# Patient Record
Sex: Female | Born: 1980 | Race: White | Hispanic: No | Marital: Married | Smoking: Never smoker
Health system: Southern US, Community
[De-identification: ages and names within clinical notes are randomized; demographics above are authoritative.]

## PROBLEM LIST (undated history)

## (undated) DIAGNOSIS — R51 Headache: Secondary | ICD-10-CM

## (undated) DIAGNOSIS — R519 Headache, unspecified: Secondary | ICD-10-CM

## (undated) DIAGNOSIS — Z789 Other specified health status: Secondary | ICD-10-CM

## (undated) HISTORY — PX: WISDOM TOOTH EXTRACTION: SHX21

## (undated) HISTORY — PX: APPENDECTOMY: SHX54

---

## 2003-07-19 ENCOUNTER — Other Ambulatory Visit: Admission: RE | Admit: 2003-07-19 | Discharge: 2003-07-19 | Payer: Self-pay | Admitting: Obstetrics and Gynecology

## 2006-08-05 ENCOUNTER — Observation Stay (HOSPITAL_COMMUNITY): Admission: RE | Admit: 2006-08-05 | Discharge: 2006-08-06 | Payer: Self-pay | Admitting: Family Medicine

## 2012-08-25 LAB — OB RESULTS CONSOLE HIV ANTIBODY (ROUTINE TESTING): HIV: NONREACTIVE

## 2012-08-25 LAB — OB RESULTS CONSOLE RPR: RPR: NONREACTIVE

## 2012-08-25 LAB — OB RESULTS CONSOLE ABO/RH: RH Type: POSITIVE

## 2012-08-25 LAB — OB RESULTS CONSOLE GC/CHLAMYDIA
Chlamydia: NEGATIVE
Gonorrhea: NEGATIVE

## 2012-08-25 LAB — OB RESULTS CONSOLE RUBELLA ANTIBODY, IGM: Rubella: IMMUNE

## 2012-10-28 ENCOUNTER — Inpatient Hospital Stay (HOSPITAL_COMMUNITY): Admit: 2012-10-28 | Payer: Self-pay | Admitting: Obstetrics and Gynecology

## 2013-04-06 ENCOUNTER — Inpatient Hospital Stay (HOSPITAL_COMMUNITY): Admission: RE | Admit: 2013-04-06 | Payer: BC Managed Care – PPO | Source: Ambulatory Visit

## 2013-04-06 ENCOUNTER — Inpatient Hospital Stay (HOSPITAL_COMMUNITY): Payer: BC Managed Care – PPO | Admitting: Anesthesiology

## 2013-04-06 ENCOUNTER — Encounter (HOSPITAL_COMMUNITY): Payer: Self-pay | Admitting: *Deleted

## 2013-04-06 ENCOUNTER — Inpatient Hospital Stay (HOSPITAL_COMMUNITY)
Admission: AD | Admit: 2013-04-06 | Discharge: 2013-04-09 | DRG: 371 | Disposition: A | Discharge status: Home / Self Care | Payer: BC Managed Care – PPO | Source: Ambulatory Visit | Attending: Obstetrics and Gynecology | Admitting: Obstetrics and Gynecology

## 2013-04-06 ENCOUNTER — Encounter (HOSPITAL_COMMUNITY): Payer: Self-pay | Admitting: Anesthesiology

## 2013-04-06 ENCOUNTER — Encounter (HOSPITAL_COMMUNITY)
Admission: AD | Disposition: A | Discharge status: Home / Self Care | Payer: Self-pay | Source: Ambulatory Visit | Attending: Obstetrics and Gynecology

## 2013-04-06 DIAGNOSIS — O4290 Premature rupture of membranes, unspecified as to length of time between rupture and onset of labor, unspecified weeks of gestation: Secondary | ICD-10-CM | POA: Diagnosis present

## 2013-04-06 DIAGNOSIS — O99892 Other specified diseases and conditions complicating childbirth: Secondary | ICD-10-CM | POA: Diagnosis present

## 2013-04-06 DIAGNOSIS — O324XX0 Maternal care for high head at term, not applicable or unspecified: Secondary | ICD-10-CM | POA: Diagnosis present

## 2013-04-06 DIAGNOSIS — Z22330 Carrier of Group B streptococcus: Secondary | ICD-10-CM

## 2013-04-06 HISTORY — DX: Other specified health status: Z78.9

## 2013-04-06 LAB — CBC
HCT: 34.9 % — ABNORMAL LOW (ref 36.0–46.0)
Hemoglobin: 11.6 g/dL — ABNORMAL LOW (ref 12.0–15.0)
MCHC: 33.2 g/dL (ref 30.0–36.0)
RBC: 3.97 MIL/uL (ref 3.87–5.11)
WBC: 9.1 10*3/uL (ref 4.0–10.5)

## 2013-04-06 LAB — RPR: RPR Ser Ql: NONREACTIVE

## 2013-04-06 SURGERY — Surgical Case
Anesthesia: Epidural | Site: Abdomen | Wound class: Clean Contaminated

## 2013-04-06 MED ORDER — SIMETHICONE 80 MG PO CHEW: 80.0000 mg | CHEWABLE_TABLET | ORAL | Status: DC | PRN

## 2013-04-06 MED ORDER — KETOROLAC TROMETHAMINE 30 MG/ML IJ SOLN: 30.0000 mg | Freq: Four times a day (QID) | INTRAMUSCULAR | Status: DC | PRN

## 2013-04-06 MED ORDER — TETANUS-DIPHTH-ACELL PERTUSSIS 5-2.5-18.5 LF-MCG/0.5 IM SUSP: 0.5000 mL | Freq: Once | INTRAMUSCULAR | Status: DC

## 2013-04-06 MED ORDER — IBUPROFEN 600 MG PO TABS: 600.0000 mg | ORAL_TABLET | Freq: Four times a day (QID) | ORAL | Status: DC

## 2013-04-06 MED ORDER — OXYTOCIN 40 UNITS IN LACTATED RINGERS INFUSION - SIMPLE MED: 62.5000 mL/h | INTRAVENOUS | Status: DC

## 2013-04-06 MED ORDER — DIPHENHYDRAMINE HCL 50 MG/ML IJ SOLN: 12.5000 mg | INTRAMUSCULAR | Status: DC | PRN

## 2013-04-06 MED ORDER — NALOXONE HCL 1 MG/ML IJ SOLN
1.0000 ug/kg/h | INTRAVENOUS | Status: DC | PRN
  Filled 2013-04-06: qty 2

## 2013-04-06 MED ORDER — MORPHINE SULFATE 0.5 MG/ML IJ SOLN
INTRAMUSCULAR | Status: AC
  Filled 2013-04-06: qty 10

## 2013-04-06 MED ORDER — SENNOSIDES-DOCUSATE SODIUM 8.6-50 MG PO TABS: 2.0000 | ORAL_TABLET | Freq: Every day | ORAL | Status: DC

## 2013-04-06 MED ORDER — LACTATED RINGERS IV SOLN
INTRAVENOUS | Status: DC | PRN
  Administered 2013-04-06: 18:00:00 via INTRAVENOUS

## 2013-04-06 MED ORDER — DIPHENHYDRAMINE HCL 25 MG PO CAPS: 25.0000 mg | ORAL_CAPSULE | Freq: Four times a day (QID) | ORAL | Status: DC | PRN

## 2013-04-06 MED ORDER — TERBUTALINE SULFATE 1 MG/ML IJ SOLN: 0.2500 mg | Freq: Once | INTRAMUSCULAR | Status: DC | PRN

## 2013-04-06 MED ORDER — CITRIC ACID-SODIUM CITRATE 334-500 MG/5ML PO SOLN
30.0000 mL | ORAL | Status: DC | PRN
  Administered 2013-04-06: 30 mL via ORAL
  Filled 2013-04-06: qty 15

## 2013-04-06 MED ORDER — KETOROLAC TROMETHAMINE 60 MG/2ML IM SOLN
60.0000 mg | Freq: Once | INTRAMUSCULAR | Status: AC | PRN
  Administered 2013-04-06: 60 mg via INTRAMUSCULAR

## 2013-04-06 MED ORDER — OXYCODONE-ACETAMINOPHEN 5-325 MG PO TABS: 1.0000 | ORAL_TABLET | ORAL | Status: DC | PRN

## 2013-04-06 MED ORDER — OXYTOCIN 10 UNIT/ML IJ SOLN
40.0000 [IU] | INTRAVENOUS | Status: DC | PRN
  Administered 2013-04-06: 40 [IU] via INTRAVENOUS

## 2013-04-06 MED ORDER — ONDANSETRON HCL 4 MG/2ML IJ SOLN: 4.0000 mg | Freq: Three times a day (TID) | INTRAMUSCULAR | Status: DC | PRN

## 2013-04-06 MED ORDER — OXYTOCIN 40 UNITS IN LACTATED RINGERS INFUSION - SIMPLE MED
1.0000 m[IU]/min | INTRAVENOUS | Status: DC
  Administered 2013-04-06: 2 m[IU]/min via INTRAVENOUS
  Filled 2013-04-06: qty 1000

## 2013-04-06 MED ORDER — ERYTHROMYCIN 5 MG/GM OP OINT
TOPICAL_OINTMENT | OPHTHALMIC | Status: AC
  Filled 2013-04-06: qty 1

## 2013-04-06 MED ORDER — FLEET ENEMA 7-19 GM/118ML RE ENEM: 1.0000 | ENEMA | Freq: Once | RECTAL | Status: DC

## 2013-04-06 MED ORDER — ONDANSETRON HCL 4 MG/2ML IJ SOLN: 4.0000 mg | Freq: Four times a day (QID) | INTRAMUSCULAR | Status: DC | PRN

## 2013-04-06 MED ORDER — SODIUM BICARBONATE 8.4 % IV SOLN
INTRAVENOUS | Status: DC | PRN
  Administered 2013-04-06: 5 mL via EPIDURAL
  Administered 2013-04-06: 3 mL via EPIDURAL
  Administered 2013-04-06: 5 mL via EPIDURAL

## 2013-04-06 MED ORDER — NALBUPHINE SYRINGE 5 MG/0.5 ML
5.0000 mg | INJECTION | INTRAMUSCULAR | Status: DC | PRN
  Filled 2013-04-06: qty 1

## 2013-04-06 MED ORDER — WITCH HAZEL-GLYCERIN EX PADS: 1.0000 "application " | MEDICATED_PAD | CUTANEOUS | Status: DC | PRN

## 2013-04-06 MED ORDER — FENTANYL 2.5 MCG/ML BUPIVACAINE 1/10 % EPIDURAL INFUSION (WH - ANES)
14.0000 mL/h | INTRAMUSCULAR | Status: DC | PRN
  Filled 2013-04-06: qty 125

## 2013-04-06 MED ORDER — SIMETHICONE 80 MG PO CHEW
80.0000 mg | CHEWABLE_TABLET | Freq: Three times a day (TID) | ORAL | Status: DC
  Administered 2013-04-07 – 2013-04-09 (×9): 80 mg via ORAL

## 2013-04-06 MED ORDER — LACTATED RINGERS IV SOLN: 500.0000 mL | Freq: Once | INTRAVENOUS | Status: DC

## 2013-04-06 MED ORDER — OXYTOCIN 10 UNIT/ML IJ SOLN
INTRAMUSCULAR | Status: AC
  Filled 2013-04-06: qty 4

## 2013-04-06 MED ORDER — MEPERIDINE HCL 25 MG/ML IJ SOLN: 6.2500 mg | INTRAMUSCULAR | Status: DC | PRN

## 2013-04-06 MED ORDER — ZOLPIDEM TARTRATE 5 MG PO TABS: 5.0000 mg | ORAL_TABLET | Freq: Every evening | ORAL | Status: DC | PRN

## 2013-04-06 MED ORDER — LORATADINE 10 MG PO TABS
10.0000 mg | ORAL_TABLET | Freq: Every day | ORAL | Status: DC
  Administered 2013-04-07 – 2013-04-09 (×3): 10 mg via ORAL
  Filled 2013-04-06 (×4): qty 1

## 2013-04-06 MED ORDER — 0.9 % SODIUM CHLORIDE (POUR BTL) OPTIME
TOPICAL | Status: DC | PRN
  Administered 2013-04-06: 1000 mL

## 2013-04-06 MED ORDER — IBUPROFEN 600 MG PO TABS
600.0000 mg | ORAL_TABLET | Freq: Four times a day (QID) | ORAL | Status: DC
  Administered 2013-04-07 – 2013-04-09 (×10): 600 mg via ORAL
  Filled 2013-04-06 (×10): qty 1

## 2013-04-06 MED ORDER — ACETAMINOPHEN 10 MG/ML IV SOLN
1000.0000 mg | Freq: Four times a day (QID) | INTRAVENOUS | Status: DC | PRN
  Filled 2013-04-06: qty 100

## 2013-04-06 MED ORDER — OXYTOCIN BOLUS FROM INFUSION: 500.0000 mL | INTRAVENOUS | Status: DC

## 2013-04-06 MED ORDER — DIBUCAINE 1 % RE OINT: 1.0000 "application " | TOPICAL_OINTMENT | RECTAL | Status: DC | PRN

## 2013-04-06 MED ORDER — PHENYLEPHRINE 40 MCG/ML (10ML) SYRINGE FOR IV PUSH (FOR BLOOD PRESSURE SUPPORT): 80.0000 ug | PREFILLED_SYRINGE | INTRAVENOUS | Status: DC | PRN

## 2013-04-06 MED ORDER — PHENYLEPHRINE 40 MCG/ML (10ML) SYRINGE FOR IV PUSH (FOR BLOOD PRESSURE SUPPORT)
80.0000 ug | PREFILLED_SYRINGE | INTRAVENOUS | Status: DC | PRN
  Filled 2013-04-06: qty 5

## 2013-04-06 MED ORDER — LACTATED RINGERS IV SOLN
INTRAVENOUS | Status: DC
  Administered 2013-04-06: 125 mL/h via INTRAVENOUS
  Administered 2013-04-06 (×3): via INTRAVENOUS

## 2013-04-06 MED ORDER — FENTANYL 2.5 MCG/ML BUPIVACAINE 1/10 % EPIDURAL INFUSION (WH - ANES)
14.0000 mL/h | INTRAMUSCULAR | Status: DC | PRN
  Administered 2013-04-06: 14 mL/h via EPIDURAL

## 2013-04-06 MED ORDER — MENTHOL 3 MG MT LOZG: 1.0000 | LOZENGE | OROMUCOSAL | Status: DC | PRN

## 2013-04-06 MED ORDER — SODIUM CHLORIDE 0.9 % IJ SOLN: 3.0000 mL | INTRAMUSCULAR | Status: DC | PRN

## 2013-04-06 MED ORDER — ONDANSETRON HCL 4 MG/2ML IJ SOLN
INTRAMUSCULAR | Status: AC
  Filled 2013-04-06: qty 2

## 2013-04-06 MED ORDER — ONDANSETRON HCL 4 MG/2ML IJ SOLN
INTRAMUSCULAR | Status: DC | PRN
  Administered 2013-04-06: 4 mg via INTRAVENOUS

## 2013-04-06 MED ORDER — METOCLOPRAMIDE HCL 5 MG/ML IJ SOLN: 10.0000 mg | Freq: Three times a day (TID) | INTRAMUSCULAR | Status: DC | PRN

## 2013-04-06 MED ORDER — LIDOCAINE HCL (PF) 1 % IJ SOLN
INTRAMUSCULAR | Status: DC | PRN
  Administered 2013-04-06 (×2): 5 mL

## 2013-04-06 MED ORDER — LACTATED RINGERS IV SOLN: INTRAVENOUS | Status: DC

## 2013-04-06 MED ORDER — EPHEDRINE 5 MG/ML INJ: 10.0000 mg | INTRAVENOUS | Status: DC | PRN

## 2013-04-06 MED ORDER — LACTATED RINGERS IV SOLN: 500.0000 mL | INTRAVENOUS | Status: DC | PRN

## 2013-04-06 MED ORDER — PENICILLIN G POTASSIUM 5000000 UNITS IJ SOLR
5.0000 10*6.[IU] | Freq: Once | INTRAVENOUS | Status: AC
  Administered 2013-04-06: 5 10*6.[IU] via INTRAVENOUS
  Filled 2013-04-06: qty 5

## 2013-04-06 MED ORDER — ONDANSETRON HCL 4 MG PO TABS: 4.0000 mg | ORAL_TABLET | ORAL | Status: DC | PRN

## 2013-04-06 MED ORDER — EPHEDRINE 5 MG/ML INJ
10.0000 mg | INTRAVENOUS | Status: DC | PRN
  Filled 2013-04-06: qty 4

## 2013-04-06 MED ORDER — SCOPOLAMINE 1 MG/3DAYS TD PT72
1.0000 | MEDICATED_PATCH | Freq: Once | TRANSDERMAL | Status: DC
  Administered 2013-04-06: 1.5 mg via TRANSDERMAL

## 2013-04-06 MED ORDER — FENTANYL CITRATE 0.05 MG/ML IJ SOLN: 25.0000 ug | INTRAMUSCULAR | Status: DC | PRN

## 2013-04-06 MED ORDER — LANOLIN HYDROUS EX OINT: 1.0000 "application " | TOPICAL_OINTMENT | CUTANEOUS | Status: DC | PRN

## 2013-04-06 MED ORDER — ONDANSETRON HCL 4 MG/2ML IJ SOLN: 4.0000 mg | INTRAMUSCULAR | Status: DC | PRN

## 2013-04-06 MED ORDER — ACETAMINOPHEN 325 MG PO TABS: 650.0000 mg | ORAL_TABLET | ORAL | Status: DC | PRN

## 2013-04-06 MED ORDER — MORPHINE SULFATE (PF) 0.5 MG/ML IJ SOLN
INTRAMUSCULAR | Status: DC | PRN
  Administered 2013-04-06: 4 mg via EPIDURAL

## 2013-04-06 MED ORDER — DIPHENHYDRAMINE HCL 25 MG PO CAPS: 25.0000 mg | ORAL_CAPSULE | ORAL | Status: DC | PRN

## 2013-04-06 MED ORDER — PRENATAL MULTIVITAMIN CH: 1.0000 | ORAL_TABLET | Freq: Every day | ORAL | Status: DC

## 2013-04-06 MED ORDER — LIDOCAINE HCL (PF) 1 % IJ SOLN
30.0000 mL | INTRAMUSCULAR | Status: DC | PRN
  Filled 2013-04-06: qty 30

## 2013-04-06 MED ORDER — LACTATED RINGERS IV SOLN
INTRAVENOUS | Status: DC
  Administered 2013-04-06: via INTRAVENOUS

## 2013-04-06 MED ORDER — NALOXONE HCL 0.4 MG/ML IJ SOLN: 0.4000 mg | INTRAMUSCULAR | Status: DC | PRN

## 2013-04-06 MED ORDER — OXYCODONE-ACETAMINOPHEN 5-325 MG PO TABS
1.0000 | ORAL_TABLET | ORAL | Status: DC | PRN
  Administered 2013-04-07 (×2): 1 via ORAL
  Administered 2013-04-08 – 2013-04-09 (×3): 2 via ORAL
  Administered 2013-04-09: 1 via ORAL
  Administered 2013-04-09: 2 via ORAL
  Filled 2013-04-06 (×3): qty 1
  Filled 2013-04-06 (×2): qty 2
  Filled 2013-04-06: qty 1
  Filled 2013-04-06: qty 2
  Filled 2013-04-06: qty 1

## 2013-04-06 MED ORDER — SIMETHICONE 80 MG PO CHEW: 80.0000 mg | CHEWABLE_TABLET | Freq: Three times a day (TID) | ORAL | Status: DC

## 2013-04-06 MED ORDER — KETOROLAC TROMETHAMINE 60 MG/2ML IM SOLN
INTRAMUSCULAR | Status: AC
  Filled 2013-04-06: qty 2

## 2013-04-06 MED ORDER — IBUPROFEN 600 MG PO TABS: 600.0000 mg | ORAL_TABLET | Freq: Four times a day (QID) | ORAL | Status: DC | PRN

## 2013-04-06 MED ORDER — SENNOSIDES-DOCUSATE SODIUM 8.6-50 MG PO TABS
2.0000 | ORAL_TABLET | Freq: Every day | ORAL | Status: DC
  Administered 2013-04-07 – 2013-04-08 (×2): 2 via ORAL

## 2013-04-06 MED ORDER — PENICILLIN G POTASSIUM 5000000 UNITS IJ SOLR
2.5000 10*6.[IU] | INTRAVENOUS | Status: DC
  Administered 2013-04-06: 2.5 10*6.[IU] via INTRAVENOUS
  Filled 2013-04-06 (×5): qty 2.5

## 2013-04-06 MED ORDER — OXYTOCIN 40 UNITS IN LACTATED RINGERS INFUSION - SIMPLE MED: 62.5000 mL/h | INTRAVENOUS | Status: AC

## 2013-04-06 MED ORDER — PRENATAL MULTIVITAMIN CH
1.0000 | ORAL_TABLET | Freq: Every day | ORAL | Status: DC
  Administered 2013-04-07 – 2013-04-09 (×3): 1 via ORAL
  Filled 2013-04-06 (×2): qty 1

## 2013-04-06 MED ORDER — DIPHENHYDRAMINE HCL 50 MG/ML IJ SOLN: 25.0000 mg | INTRAMUSCULAR | Status: DC | PRN

## 2013-04-06 MED ORDER — LACTATED RINGERS IV SOLN
500.0000 mL | Freq: Once | INTRAVENOUS | Status: AC
  Administered 2013-04-06: 500 mL via INTRAVENOUS

## 2013-04-06 MED ORDER — CEFAZOLIN SODIUM-DEXTROSE 2-3 GM-% IV SOLR
2.0000 g | Freq: Once | INTRAVENOUS | Status: AC
  Administered 2013-04-06: 2 g via INTRAVENOUS
  Filled 2013-04-06: qty 50

## 2013-04-06 MED ORDER — SCOPOLAMINE 1 MG/3DAYS TD PT72
MEDICATED_PATCH | TRANSDERMAL | Status: AC
  Filled 2013-04-06: qty 1

## 2013-04-06 SURGICAL SUPPLY — 31 items
ADH SKN CLS APL DERMABOND .7 (GAUZE/BANDAGES/DRESSINGS)
CLAMP CORD UMBIL (MISCELLANEOUS) IMPLANT
CLOTH BEACON ORANGE TIMEOUT ST (SAFETY) ×2 IMPLANT
DERMABOND ADVANCED (GAUZE/BANDAGES/DRESSINGS)
DERMABOND ADVANCED .7 DNX12 (GAUZE/BANDAGES/DRESSINGS) IMPLANT
DRAPE LG THREE QUARTER DISP (DRAPES) ×2 IMPLANT
DRSG OPSITE POSTOP 4X10 (GAUZE/BANDAGES/DRESSINGS) ×2 IMPLANT
DURAPREP 26ML APPLICATOR (WOUND CARE) ×2 IMPLANT
ELECT REM PT RETURN 9FT ADLT (ELECTROSURGICAL) ×2
ELECTRODE REM PT RTRN 9FT ADLT (ELECTROSURGICAL) ×1 IMPLANT
EXTRACTOR VACUUM M CUP 4 TUBE (SUCTIONS) IMPLANT
GLOVE BIO SURGEON STRL SZ 6 (GLOVE) ×2 IMPLANT
GLOVE BIOGEL PI IND STRL 6 (GLOVE) ×2 IMPLANT
GLOVE BIOGEL PI INDICATOR 6 (GLOVE) ×2
GOWN STRL REIN XL XLG (GOWN DISPOSABLE) ×4 IMPLANT
KIT ABG SYR 3ML LUER SLIP (SYRINGE) ×2 IMPLANT
NDL HYPO 25X5/8 SAFETYGLIDE (NEEDLE) ×1 IMPLANT
NEEDLE HYPO 25X5/8 SAFETYGLIDE (NEEDLE) ×2 IMPLANT
NS IRRIG 1000ML POUR BTL (IV SOLUTION) ×2 IMPLANT
PACK C SECTION WH (CUSTOM PROCEDURE TRAY) ×2 IMPLANT
PAD OB MATERNITY 4.3X12.25 (PERSONAL CARE ITEMS) ×2 IMPLANT
STAPLER VISISTAT 35W (STAPLE) ×1 IMPLANT
SUT CHROMIC 0 CTX 36 (SUTURE) ×5 IMPLANT
SUT MON AB 2-0 CT1 27 (SUTURE) ×2 IMPLANT
SUT PDS AB 0 CT1 27 (SUTURE) IMPLANT
SUT PLAIN 0 NONE (SUTURE) IMPLANT
SUT VIC AB 0 CT1 36 (SUTURE) ×1 IMPLANT
SUT VIC AB 4-0 KS 27 (SUTURE) IMPLANT
TOWEL OR 17X24 6PK STRL BLUE (TOWEL DISPOSABLE) ×6 IMPLANT
TRAY FOLEY CATH 14FR (SET/KITS/TRAYS/PACK) IMPLANT
WATER STERILE IRR 1000ML POUR (IV SOLUTION) ×1 IMPLANT

## 2013-04-06 NOTE — Op Note (Signed)
Jarold Song PROCEDURE DATE: 04/06/2013  PREOPERATIVE DIAGNOSIS: Intrauterine pregnancy at  [redacted]w[redacted]d weeks gestation with arrest of descent  POSTOPERATIVE DIAGNOSIS: The same  PROCEDURE:    Low Transverse Cesarean Section  SURGEON:  Dr. Mitchel Honour  INDICATIONS: Amber Downs is a 31 y.o. G1P1001 at [redacted]w[redacted]d scheduled for cesarean section secondary arrest of descent.  The risks of cesarean section discussed with the patient included but were not limited to: bleeding which may require transfusion or reoperation; infection which may require antibiotics; injury to bowel, bladder, ureters or other surrounding organs; injury to the fetus; need for additional procedures including hysterectomy in the event of a life-threatening hemorrhage; placental abnormalities wth subsequent pregnancies, incisional problems, thromboembolic phenomenon and other postoperative/anesthesia complications. The patient concurred with the proposed plan, giving informed written consent for the procedure.    FINDINGS:  Viable female infant in cephalic presentation, APGAR 9,9  weight pending Clear amniotic fluid.  Intact placenta, three vessel cord.  Grossly normal uterus, ovaries and fallopian tubes. .   ANESTHESIA:    Epidural ESTIMATED BLOOD LOSS: 800 ml SPECIMENS: Placenta sent to L&D COMPLICATIONS: None immediate  PROCEDURE IN DETAIL:  The patient received intravenous antibiotics and had sequential compression devices applied to her lower extremities while in the preoperative area.  She was then taken to the operating room where epidural anesthesia was dosed up to surgical level and was found to be adequate. She was then placed in a dorsal supine position with a leftward tilt, and prepped and draped in a sterile manner.  A foley catheter was placed into her bladder and attached to constant gravity.  After an adequate timeout was performed, a Pfannenstiel skin incision was made with scalpel and carried through to the underlying  layer of fascia. The fascia was incised in the midline and this incision was extended bilaterally using the Mayo scissors. Kocher clamps were applied to the superior aspect of the fascial incision and the underlying rectus muscles were dissected off bluntly. A similar process was carried out on the inferior aspect of the facial incision. The rectus muscles were separated in the midline bluntly and the peritoneum was entered bluntly.  A bladder flap was created sharply and developed bluntly.  It was protected behind the bladder blade.   A transverse hysterotomy was made with a scalpel and extended bilaterally bluntly. The bladder blade was then removed. The infant was successfully delivered, and cord was clamped and cut and infant was handed over to awaiting neonatology team. Uterine massage was then administered and the placenta delivered intact with three-vessel cord. The uterus was cleared of clot and debris.  The hysterotomy was closed with #1 Chromic.  A second imbricating suture of #1 Chromic was used to reinforce the incision and aid in hemostasis.  The peritoneum and rectus muscles were noted to be hemostatic and were reapproximated with 2-0 Monocryl.  The fascia was closed with 0-Vicryl in a running fashion with good restoration of anatomy.  The subcutaneus tissue was copiously irrigated.  The skin was closed with staples.  Pt tolerated the procedure will.  All counts were correct x2.  Pt went to the recovery room in stable condition.

## 2013-04-06 NOTE — Progress Notes (Signed)
Patient has been pushing x 2 hours with no descent from +2 station, significant caput, and narrow pelvis.  She is feeling severe pressure and exhausted.  FHT reassuring.  She is offered C/S for arrest of descent and wishes to proceed.  She is informed of the risk of bleeding, damage to surrounding structures, infection, and effects in future pregnancies.  All of her questions were answered and she wishes to proceed.

## 2013-04-06 NOTE — MAU Note (Signed)
Pt states her water broke at 0430-clear fluid-no contractions

## 2013-04-06 NOTE — Anesthesia Preprocedure Evaluation (Signed)

## 2013-04-06 NOTE — H&P (Signed)
Amber Downs is a 32 y.o. female presenting for PROM at 0430 this am.  She reports beginning to feel occasional mild CTX.  No VB.  +FM.  No antepartum complications.  GBS negative.  Maternal Medical History:  Reason for admission: Rupture of membranes.   Contractions: Onset was 3-5 hours ago.    Fetal activity: Perceived fetal activity is normal.   Last perceived fetal movement was within the past hour.    Prenatal complications: no prenatal complications Prenatal Complications - Diabetes: none.    OB History   Grav Para Term Preterm Abortions TAB SAB Ect Mult Living   1              Past Medical History  Diagnosis Date  . Medical history non-contributory    Past Surgical History  Procedure Laterality Date  . Appendectomy     Family History: family history is not on file. Social History:  reports that she has never smoked. She does not have any smokeless tobacco history on file. She reports that she does not drink alcohol or use illicit drugs.   Prenatal Transfer Tool  Maternal Diabetes: No Genetic Screening: Normal Maternal Ultrasounds/Referrals: Normal Fetal Ultrasounds or other Referrals:  None Maternal Substance Abuse:  No Significant Maternal Medications:  None Significant Maternal Lab Results:  None Other Comments:  None  ROS  Dilation: 1 Effacement (%): 80 Station: -2 Exam by:: M.Topp,RN Blood pressure 117/75, pulse 98, temperature 98 F (36.7 C), temperature source Oral, resp. rate 20, height 5\' 3"  (1.6 m), weight 81.251 kg (179 lb 2 oz). Maternal Exam:  Uterine Assessment: Contraction strength is mild.  Contraction frequency is rare.   Abdomen: Patient reports no abdominal tenderness. Fundal height is c/w dates.   Estimated fetal weight is 7#8.   Fetal presentation: vertex     Physical Exam  Constitutional: She is oriented to person, place, and time. She appears well-developed and well-nourished.  GI: Soft. There is no rebound and no guarding.   Neurological: She is alert and oriented to person, place, and time.  Skin: Skin is warm and dry.  Psychiatric: She has a normal mood and affect. Her behavior is normal.    Prenatal labs: ABO, Rh: O/Positive/-- (10/29 0000) Antibody: Negative (10/29 0000) Rubella: Immune (10/29 0000) RPR: Nonreactive (10/29 0000)  HBsAg: Negative (10/29 0000)  HIV: Non-reactive (10/29 0000)  GBS: Positive (05/06 0000)   Assessment/Plan: 31yo G1 at [redacted]w[redacted]d with PROM -Will start pitocin -Epidural if desired -GBS negative   Juanda Luba 04/06/2013, 7:59 AM

## 2013-04-06 NOTE — Anesthesia Postprocedure Evaluation (Signed)
  Anesthesia Post-op Note  Patient: Amber Downs  Procedure(s) Performed: Procedure(s) with comments: CESAREAN SECTION (N/A) - Primary Cesarean Section Delivery Baby Girl @ [phone], Apgars  Patient is awake, responsive, moving her legs, and has signs of resolution of her numbness. Pain and nausea are reasonably well controlled. Vital signs are stable and clinically acceptable. Oxygen saturation is clinically acceptable. There are no apparent anesthetic complications at this time. Patient is ready for discharge.

## 2013-04-06 NOTE — Transfer of Care (Signed)
Immediate Anesthesia Transfer of Care Note  Patient: Amber Downs  Procedure(s) Performed: Procedure(s) with comments: CESAREAN SECTION (N/A) - Primary Cesarean Section Delivery Baby Girl @ 1803, Apgars  Patient Location: PACU  Anesthesia Type:Epidural  Level of Consciousness: awake  Airway & Oxygen Therapy: Patient Spontanous Breathing  Post-op Assessment: Report given to PACU RN  Post vital signs: Reviewed and stable  Complications: No apparent anesthesia complications

## 2013-04-06 NOTE — Anesthesia Procedure Notes (Signed)
Epidural Patient location during procedure: OB Start time: 04/06/2013 10:07 AM  Staffing Anesthesiologist: Angus Seller., Harrell Gave. Performed by: anesthesiologist   Preanesthetic Checklist Completed: patient identified, site marked, surgical consent, pre-op evaluation, timeout performed, IV checked, risks and benefits discussed and monitors and equipment checked  Epidural Patient position: sitting Prep: site prepped and draped and DuraPrep Patient monitoring: continuous pulse ox and blood pressure Approach: midline Injection technique: LOR air and LOR saline  Needle:  Needle type: Tuohy  Needle gauge: 17 G Needle length: 9 cm and 9 Needle insertion depth: 6 cm Catheter type: closed end flexible Catheter size: 19 Gauge Catheter at skin depth: 12 cm Test dose: negative  Assessment Events: blood not aspirated, injection not painful, no injection resistance, negative IV test and no paresthesia  Additional Notes Patient identified.  Risk benefits discussed including failed block, incomplete pain control, headache, nerve damage, paralysis, blood pressure changes, nausea, vomiting, reactions to medication both toxic or allergic, and postpartum back pain.  Patient expressed understanding and wished to proceed.  All questions were answered.  Sterile technique used throughout procedure and epidural site dressed with sterile barrier dressing. No paresthesia or other complications noted.The patient did not experience any signs of intravascular injection such as tinnitus or metallic taste in mouth nor signs of intrathecal spread such as rapid motor block. Please see nursing notes for vital signs.

## 2013-04-07 ENCOUNTER — Encounter (HOSPITAL_COMMUNITY): Payer: Self-pay | Admitting: Obstetrics & Gynecology

## 2013-04-07 LAB — CBC
MCH: 30 pg (ref 26.0–34.0)
MCV: 89.1 fL (ref 78.0–100.0)
Platelets: 224 10*3/uL (ref 150–400)
RDW: 14 % (ref 11.5–15.5)
WBC: 10.5 10*3/uL (ref 4.0–10.5)

## 2013-04-07 NOTE — Progress Notes (Signed)
Ur chart reviewed completed.  

## 2013-04-07 NOTE — Progress Notes (Signed)
Subjective: Postpartum Day 1: Cesarean Delivery Patient reports tolerating PO and + flatus.    Objective: Vital signs in last 24 hours: Temp:  [97.5 F (36.4 C)-98.7 F (37.1 C)] 98.3 F (36.8 C) (06/11 0630) Pulse Rate:  [64-94] 86 (06/11 0634) Resp:  [16-26] 18 (06/11 0630) BP: (85-122)/(50-90) 92/60 mmHg (06/11 0634) SpO2:  [93 %-100 %] 95 % (06/11 0630)  Physical Exam:  General: alert and cooperative Lochia: appropriate Uterine Fundus: firm Incision: honeycomb dressing noted with small old drainage noted, patient reports that dressing has been changed during the night after saturation, no active bleeding noted DVT Evaluation: No evidence of DVT seen on physical exam. Negative Homan's sign. No cords or calf tenderness. No significant calf/ankle edema. + BS   Recent Labs  04/06/13 0650 04/07/13 0610  HGB 11.6* 9.1*  HCT 34.9* 27.0*    Assessment/Plan: Status post Cesarean section. Doing well postoperatively.  Continue current care.  Giang Hemme G 04/07/2013, 8:30 AM

## 2013-04-07 NOTE — Anesthesia Postprocedure Evaluation (Signed)
Anesthesia Post Note  Patient: Amber Downs  Procedure(s) Performed: Procedure(s) (LRB): CESAREAN SECTION (N/A)  Anesthesia type: Epidural  Patient location: Mother/Baby  Post pain: Pain level controlled  Post assessment: Post-op Vital signs reviewed  Last Vitals:  Filed Vitals:   04/07/13 1040  BP: 80/46  Pulse: 79  Temp: 37 C  Resp: 18    Post vital signs: Reviewed  Level of consciousness: awake  Complications: No apparent anesthesia complications

## 2013-04-08 NOTE — Progress Notes (Signed)
Subjective: Postpartum Day 2: Cesarean Delivery Patient reports tolerating PO, + flatus and no problems voiding.    Objective: Vital signs in last 24 hours: Temp:  [97.4 F (36.3 C)-99 F (37.2 C)] 97.4 F (36.3 C) (06/12 1610) Pulse Rate:  [78-91] 78 (06/12 0614) Resp:  [18-20] 20 (06/12 0614) BP: (80-105)/(46-60) 89/60 mmHg (06/12 0614) SpO2:  [95 %-96 %] 95 % (06/12 9604)  Physical Exam:  General: alert and cooperative Lochia: appropriate Uterine Fundus: firm Incision: honeycomb dressing noted with scant drainage on bandage. Pressure dressing removed this am. DVT Evaluation: No evidence of DVT seen on physical exam. Negative Homan's sign. No cords or calf tenderness. No significant calf/ankle edema.   Recent Labs  04/06/13 0650 04/07/13 0610  HGB 11.6* 9.1*  HCT 34.9* 27.0*    Assessment/Plan: Status post Cesarean section. Doing well postoperatively.  Continue current care Plan discharge in am.  Loreal Schuessler G 04/08/2013, 8:19 AM

## 2013-04-09 MED ORDER — OXYCODONE-ACETAMINOPHEN 5-325 MG PO TABS: 1.0000 | ORAL_TABLET | ORAL | Status: DC | PRN

## 2013-04-09 MED ORDER — IBUPROFEN 600 MG PO TABS: 600.0000 mg | ORAL_TABLET | Freq: Four times a day (QID) | ORAL | Status: DC

## 2013-04-09 NOTE — Discharge Summary (Signed)
Obstetric Discharge Summary Reason for Admission: rupture of membranes Prenatal Procedures: ultrasound Intrapartum Procedures: cesarean: low cervical, transverse Postpartum Procedures: none Complications-Operative and Postpartum: none Hemoglobin  Date Value Range Status  04/07/2013 9.1* 12.0 - 15.0 g/dL Final     DELTA CHECK NOTED     REPEATED TO VERIFY     HCT  Date Value Range Status  04/07/2013 27.0* 36.0 - 46.0 % Final    Physical Exam:  General: alert and cooperative Lochia: appropriate Uterine Fundus: firm Incision: honeycomb dressing with small old drainage noted on bandage DVT Evaluation: No evidence of DVT seen on physical exam. Negative Homan's sign. No cords or calf tenderness. No significant calf/ankle edema.  Discharge Diagnoses: Term Pregnancy-delivered  Discharge Information: Date: 04/09/2013 Activity: pelvic rest Diet: routine Medications: PNV, Ibuprofen and Percocet Condition: stable Instructions: refer to practice specific booklet Discharge to: home   Newborn Data: Live born female  Birth Weight: 6 lb 10.7 oz (3025 g) APGAR: 9, 9  Home with mother.  Merleen Picazo G 04/09/2013, 9:10 AM

## 2013-05-31 ENCOUNTER — Other Ambulatory Visit: Payer: Self-pay | Admitting: Obstetrics and Gynecology

## 2014-08-03 ENCOUNTER — Other Ambulatory Visit: Payer: Self-pay | Admitting: Obstetrics and Gynecology

## 2014-08-04 LAB — CYTOLOGY - PAP

## 2014-08-29 ENCOUNTER — Encounter (HOSPITAL_COMMUNITY): Payer: Self-pay | Admitting: Obstetrics & Gynecology

## 2016-01-18 LAB — OB RESULTS CONSOLE RPR: RPR: NONREACTIVE

## 2016-01-18 LAB — OB RESULTS CONSOLE HEPATITIS B SURFACE ANTIGEN: Hepatitis B Surface Ag: NEGATIVE

## 2016-01-18 LAB — OB RESULTS CONSOLE ABO/RH: RH TYPE: POSITIVE

## 2016-01-18 LAB — OB RESULTS CONSOLE RUBELLA ANTIBODY, IGM: RUBELLA: IMMUNE

## 2016-01-18 LAB — OB RESULTS CONSOLE GC/CHLAMYDIA
Chlamydia: NEGATIVE
GC PROBE AMP, GENITAL: NEGATIVE

## 2016-01-18 LAB — OB RESULTS CONSOLE ANTIBODY SCREEN: ANTIBODY SCREEN: NEGATIVE

## 2016-01-18 LAB — OB RESULTS CONSOLE HIV ANTIBODY (ROUTINE TESTING): HIV: NONREACTIVE

## 2016-07-22 LAB — OB RESULTS CONSOLE GBS: STREP GROUP B AG: NEGATIVE

## 2016-08-08 ENCOUNTER — Telehealth (HOSPITAL_COMMUNITY): Payer: Self-pay | Admitting: *Deleted

## 2016-08-08 ENCOUNTER — Encounter (HOSPITAL_COMMUNITY): Payer: Self-pay | Admitting: *Deleted

## 2016-08-08 NOTE — Telephone Encounter (Signed)
Preadmission screen  

## 2016-08-08 NOTE — H&P (Signed)
Amber Downs is a 35 y.o. G 2 P 1 at 39 weeks presents for Repeat LTCS. OB History    Gravida Para Term Preterm AB Living   1 1 1     1    SAB TAB Ectopic Multiple Live Births           1     Past Medical History:  Diagnosis Date  . Medical history non-contributory    Past Surgical History:  Procedure Laterality Date  . APPENDECTOMY    . CESAREAN SECTION N/A 04/06/2013   Procedure: CESAREAN SECTION;  Surgeon: Mitchel HonourMegan Morris, DO;  Location: [facility];  Service: Obstetrics;  Laterality: N/A;  Primary Cesarean Section Delivery Baby Girl @ [id number], Apgars   Family History: family history is not on file. Social History:  reports that she has never smoked. She does not have any smokeless tobacco history on file. She reports that she does not drink alcohol or use drugs.     Maternal Diabetes: No Genetic Screening: Normal Maternal Ultrasounds/Referrals: Normal Fetal Ultrasounds or other Referrals:  None Maternal Substance Abuse:  No Significant Maternal Medications:  None Significant Maternal Lab Results:  None Other Comments:  None  Review of Systems  All other systems reviewed and are negative.  History   unknown if currently breastfeeding. Maternal Exam:  Abdomen: Fetal presentation: vertex     Physical Exam  Nursing note and vitals reviewed. Constitutional: She appears well-developed and well-nourished.  HENT:  Head: Normocephalic.  Eyes: Pupils are equal, round, and reactive to light.  Neck: Normal range of motion.  Cardiovascular: Normal rate and regular rhythm.   Respiratory: Effort normal.  GI: Soft.    Prenatal labs: ABO, Rh:   Antibody:   Rubella:   RPR:    HBsAg:    HIV:    GBS:     Assessment/Plan: IUP at 39 weeks  Previous C Section Repeat LTCS Risks reviewed Consent signed   Ellington Cornia L 08/08/2016, 7:25 AM

## 2016-08-13 ENCOUNTER — Encounter (HOSPITAL_COMMUNITY): Payer: Self-pay | Admitting: *Deleted

## 2016-08-13 ENCOUNTER — Inpatient Hospital Stay (HOSPITAL_COMMUNITY): Payer: BLUE CROSS/BLUE SHIELD | Admitting: Anesthesiology

## 2016-08-13 ENCOUNTER — Inpatient Hospital Stay (HOSPITAL_COMMUNITY)
Admission: AD | Admit: 2016-08-13 | Discharge: 2016-08-14 | DRG: 775 | Disposition: A | Discharge status: Home / Self Care | Payer: BLUE CROSS/BLUE SHIELD | Source: Ambulatory Visit | Attending: Obstetrics and Gynecology | Admitting: Obstetrics and Gynecology

## 2016-08-13 DIAGNOSIS — O34219 Maternal care for unspecified type scar from previous cesarean delivery: Secondary | ICD-10-CM | POA: Diagnosis present

## 2016-08-13 DIAGNOSIS — O34211 Maternal care for low transverse scar from previous cesarean delivery: Secondary | ICD-10-CM | POA: Diagnosis present

## 2016-08-13 DIAGNOSIS — Z8249 Family history of ischemic heart disease and other diseases of the circulatory system: Secondary | ICD-10-CM | POA: Diagnosis not present

## 2016-08-13 DIAGNOSIS — Z833 Family history of diabetes mellitus: Secondary | ICD-10-CM

## 2016-08-13 DIAGNOSIS — Z3A39 39 weeks gestation of pregnancy: Secondary | ICD-10-CM | POA: Diagnosis not present

## 2016-08-13 DIAGNOSIS — Z823 Family history of stroke: Secondary | ICD-10-CM | POA: Diagnosis not present

## 2016-08-13 LAB — CBC
HCT: 35.9 % — ABNORMAL LOW (ref 36.0–46.0)
HEMATOCRIT: 29.3 % — AB (ref 36.0–46.0)
Hemoglobin: 12.1 g/dL (ref 12.0–15.0)
Hemoglobin: 9.9 g/dL — ABNORMAL LOW (ref 12.0–15.0)
MCH: 29 pg (ref 26.0–34.0)
MCH: 29.3 pg (ref 26.0–34.0)
MCHC: 33.7 g/dL (ref 30.0–36.0)
MCHC: 33.8 g/dL (ref 30.0–36.0)
MCV: 86.1 fL (ref 78.0–100.0)
MCV: 86.7 fL (ref 78.0–100.0)
PLATELETS: 335 10*3/uL (ref 150–400)
Platelets: 302 10*3/uL (ref 150–400)
RBC: 4.17 MIL/uL (ref 3.87–5.11)
RBC: 3.38 MIL/uL — ABNORMAL LOW (ref 3.87–5.11)
RDW: 14.3 % (ref 11.5–15.5)
RDW: 14 % (ref 11.5–15.5)
WBC: 11.1 10*3/uL — AB (ref 4.0–10.5)
WBC: 15.2 10*3/uL — ABNORMAL HIGH (ref 4.0–10.5)

## 2016-08-13 LAB — TYPE AND SCREEN
ABO/RH(D): O POS
Antibody Screen: NEGATIVE

## 2016-08-13 LAB — ABO/RH: ABO/RH(D): O POS

## 2016-08-13 LAB — RPR: RPR: NONREACTIVE

## 2016-08-13 MED ORDER — FLEET ENEMA 7-19 GM/118ML RE ENEM: 1.0000 | ENEMA | RECTAL | Status: DC | PRN

## 2016-08-13 MED ORDER — PRENATAL MULTIVITAMIN CH
1.0000 | ORAL_TABLET | Freq: Every day | ORAL | Status: DC
  Administered 2016-08-13: 1 via ORAL
  Filled 2016-08-13: qty 1

## 2016-08-13 MED ORDER — ACETAMINOPHEN 325 MG PO TABS: 650.0000 mg | ORAL_TABLET | ORAL | Status: DC | PRN

## 2016-08-13 MED ORDER — OXYCODONE HCL 5 MG PO TABS: 5.0000 mg | ORAL_TABLET | ORAL | Status: DC | PRN

## 2016-08-13 MED ORDER — IBUPROFEN 600 MG PO TABS
600.0000 mg | ORAL_TABLET | Freq: Four times a day (QID) | ORAL | Status: DC
  Administered 2016-08-13 – 2016-08-14 (×5): 600 mg via ORAL
  Filled 2016-08-13 (×5): qty 1

## 2016-08-13 MED ORDER — SIMETHICONE 80 MG PO CHEW: 80.0000 mg | CHEWABLE_TABLET | ORAL | Status: DC | PRN

## 2016-08-13 MED ORDER — TETANUS-DIPHTH-ACELL PERTUSSIS 5-2.5-18.5 LF-MCG/0.5 IM SUSP: 0.5000 mL | Freq: Once | INTRAMUSCULAR | Status: DC

## 2016-08-13 MED ORDER — DIBUCAINE 1 % RE OINT: 1.0000 "application " | TOPICAL_OINTMENT | RECTAL | Status: DC | PRN

## 2016-08-13 MED ORDER — EPHEDRINE 5 MG/ML INJ
10.0000 mg | INTRAVENOUS | Status: DC | PRN
  Filled 2016-08-13: qty 4

## 2016-08-13 MED ORDER — FENTANYL 2.5 MCG/ML BUPIVACAINE 1/10 % EPIDURAL INFUSION (WH - ANES): 14.0000 mL/h | INTRAMUSCULAR | Status: DC | PRN

## 2016-08-13 MED ORDER — DIPHENHYDRAMINE HCL 25 MG PO CAPS: 25.0000 mg | ORAL_CAPSULE | Freq: Four times a day (QID) | ORAL | Status: DC | PRN

## 2016-08-13 MED ORDER — OXYCODONE-ACETAMINOPHEN 5-325 MG PO TABS: 1.0000 | ORAL_TABLET | ORAL | Status: DC | PRN

## 2016-08-13 MED ORDER — OXYCODONE HCL 5 MG PO TABS: 10.0000 mg | ORAL_TABLET | ORAL | Status: DC | PRN

## 2016-08-13 MED ORDER — SENNOSIDES-DOCUSATE SODIUM 8.6-50 MG PO TABS
2.0000 | ORAL_TABLET | ORAL | Status: DC
  Administered 2016-08-14: 2 via ORAL
  Filled 2016-08-13: qty 2

## 2016-08-13 MED ORDER — FENTANYL 2.5 MCG/ML BUPIVACAINE 1/10 % EPIDURAL INFUSION (WH - ANES)
INTRAMUSCULAR | Status: AC
  Filled 2016-08-13: qty 125

## 2016-08-13 MED ORDER — ZOLPIDEM TARTRATE 5 MG PO TABS: 5.0000 mg | ORAL_TABLET | Freq: Every evening | ORAL | Status: DC | PRN

## 2016-08-13 MED ORDER — COCONUT OIL OIL: 1.0000 "application " | TOPICAL_OIL | Status: DC | PRN

## 2016-08-13 MED ORDER — LACTATED RINGERS IV SOLN
INTRAVENOUS | Status: DC
  Administered 2016-08-13: 01:00:00 via INTRAVENOUS

## 2016-08-13 MED ORDER — OXYTOCIN BOLUS FROM INFUSION
500.0000 mL | Freq: Once | INTRAVENOUS | Status: AC
  Administered 2016-08-13: 500 mL via INTRAVENOUS

## 2016-08-13 MED ORDER — PHENYLEPHRINE 40 MCG/ML (10ML) SYRINGE FOR IV PUSH (FOR BLOOD PRESSURE SUPPORT)
80.0000 ug | PREFILLED_SYRINGE | INTRAVENOUS | Status: DC | PRN
  Filled 2016-08-13: qty 5

## 2016-08-13 MED ORDER — ONDANSETRON HCL 4 MG PO TABS: 4.0000 mg | ORAL_TABLET | ORAL | Status: DC | PRN

## 2016-08-13 MED ORDER — ONDANSETRON HCL 4 MG/2ML IJ SOLN: 4.0000 mg | INTRAMUSCULAR | Status: DC | PRN

## 2016-08-13 MED ORDER — LACTATED RINGERS IV SOLN
500.0000 mL | Freq: Once | INTRAVENOUS | Status: AC
  Administered 2016-08-13: 500 mL via INTRAVENOUS

## 2016-08-13 MED ORDER — LIDOCAINE HCL (PF) 1 % IJ SOLN
30.0000 mL | INTRAMUSCULAR | Status: DC | PRN
  Administered 2016-08-13: 30 mL via SUBCUTANEOUS
  Filled 2016-08-13: qty 30

## 2016-08-13 MED ORDER — BENZOCAINE-MENTHOL 20-0.5 % EX AERO
1.0000 "application " | INHALATION_SPRAY | CUTANEOUS | Status: DC | PRN
  Administered 2016-08-13: 1 via TOPICAL
  Filled 2016-08-13: qty 56

## 2016-08-13 MED ORDER — DIPHENHYDRAMINE HCL 50 MG/ML IJ SOLN: 12.5000 mg | INTRAMUSCULAR | Status: DC | PRN

## 2016-08-13 MED ORDER — LACTATED RINGERS IV SOLN: 500.0000 mL | INTRAVENOUS | Status: DC | PRN

## 2016-08-13 MED ORDER — SOD CITRATE-CITRIC ACID 500-334 MG/5ML PO SOLN: 30.0000 mL | ORAL | Status: DC | PRN

## 2016-08-13 MED ORDER — ONDANSETRON HCL 4 MG/2ML IJ SOLN: 4.0000 mg | Freq: Four times a day (QID) | INTRAMUSCULAR | Status: DC | PRN

## 2016-08-13 MED ORDER — WITCH HAZEL-GLYCERIN EX PADS: 1.0000 "application " | MEDICATED_PAD | CUTANEOUS | Status: DC | PRN

## 2016-08-13 MED ORDER — OXYCODONE-ACETAMINOPHEN 5-325 MG PO TABS: 2.0000 | ORAL_TABLET | ORAL | Status: DC | PRN

## 2016-08-13 MED ORDER — OXYTOCIN 40 UNITS IN LACTATED RINGERS INFUSION - SIMPLE MED
2.5000 [IU]/h | INTRAVENOUS | Status: DC
  Administered 2016-08-13: 2.5 [IU]/h via INTRAVENOUS
  Filled 2016-08-13: qty 1000

## 2016-08-13 NOTE — Anesthesia Postprocedure Evaluation (Signed)
Anesthesia Post Note  Patient: Amber Downs  Procedure(s) Performed: * No procedures listed *  Patient location during evaluation: L&D Level of consciousness: awake Pain management: pain level not controlled Cardiovascular status: stable Anesthetic complications: no Comments: Pt completely dilated. No procedure was performed     Last Vitals: There were no vitals filed for this visit.  Last Pain: There were no vitals filed for this visit. Pain Goal:                 Luisenrique Conran JR,JOHN Yves Fodor

## 2016-08-13 NOTE — Progress Notes (Signed)
Delivery Note At 1:57 AM a viable female was delivered via Vaginal, Spontaneous Delivery (Presentation:LOA ;  ).  APGAR: 8, 9; weight pending .   Placenta status:intact , .  3 vessels:  with the following true knot x 1: .  Cord pH: not done Rapid second stage.  Anesthesia:  1% lidocaine local Episiotomy: Median Lacerations: 2nd degree Suture Repair: 3.0 vicryl rapide Est. Blood Loss (mL):  300  Mom to postpartum.  Baby to Couplet care / Skin to Skin.  Haden Suder II,Siarah Deleo E 08/13/2016, 2:19 AM

## 2016-08-13 NOTE — Progress Notes (Signed)
Notified of pt arrival in MAU and exam with advanced dilation and scheduled for repeat c/s. Pt wants to try vaginal delivery since she is so far dilated already. Will admit to labor and delivery

## 2016-08-13 NOTE — Anesthesia Preprocedure Evaluation (Signed)
Anesthesia Evaluation  Patient identified by MRN, date of birth, ID band Patient awake    Reviewed: Allergy & Precautions, H&P , NPO status , Patient's Chart, lab work & pertinent test results  Airway Mallampati: II  TM Distance: >3 FB Neck ROM: full    Dental no notable dental hx.    Pulmonary neg pulmonary ROS,    Pulmonary exam normal        Cardiovascular negative cardio ROS Normal cardiovascular exam     Neuro/Psych negative psych ROS   GI/Hepatic negative GI ROS, Neg liver ROS,   Endo/Other  negative endocrine ROS  Renal/GU negative Renal ROS     Musculoskeletal   Abdominal (+) + obese,   Peds  Hematology negative hematology ROS (+)   Anesthesia Other Findings   Reproductive/Obstetrics (+) Pregnancy                             Anesthesia Physical Anesthesia Plan  ASA: II  Anesthesia Plan: Epidural   Post-op Pain Management:    Induction:   Airway Management Planned:   Additional Equipment:   Intra-op Plan:   Post-operative Plan:   Informed Consent: I have reviewed the patients History and Physical, chart, labs and discussed the procedure including the risks, benefits and alternatives for the proposed anesthesia with the patient or authorized representative who has indicated his/her understanding and acceptance.     Plan Discussed with:   Anesthesia Plan Comments:         Anesthesia Quick Evaluation  

## 2016-08-13 NOTE — Progress Notes (Signed)
Post Partum Day 0 Subjective: no complaints, up ad lib, voiding and tolerating PO  Objective: Blood pressure 111/63, pulse 75, temperature 98.2 F (36.8 C), resp. rate 18, height 5\' 4"  (1.626 m), last menstrual period 11/14/2015, SpO2 100 %, unknown if currently breastfeeding.  Physical Exam:  General: alert and cooperative Lochia: appropriate Uterine Fundus: firm Incision: healing well DVT Evaluation: No evidence of DVT seen on physical exam. Negative Homan's sign. No cords or calf tenderness. No significant calf/ankle edema.   Recent Labs  08/13/16 0105 08/13/16 0550  HGB 12.1 9.9*  HCT 35.9* 29.3*    Assessment/Plan: Plan for discharge tomorrow   LOS: 0 days   Tevita Gomer G 08/13/2016, 8:44 AM

## 2016-08-13 NOTE — H&P (Signed)
Amber Downs is a 35 y.o. female presenting for UCs. Prenatal care complicated by previous LTCS for arrest of descent.  OB History    Gravida Para Term Preterm AB Living   2 1 1     1    SAB TAB Ectopic Multiple Live Births           1     Past Medical History:  Diagnosis Date  . Headache   . Medical history non-contributory    Past Surgical History:  Procedure Laterality Date  . APPENDECTOMY    . CESAREAN SECTION N/A 04/06/2013   Procedure: CESAREAN SECTION;  Surgeon: Mitchel HonourMegan Morris, DO;  Location: [facility];  Service: Obstetrics;  Laterality: N/A;  Primary Cesarean Section Delivery Baby Girl @ 1803, Apgars  . WISDOM TOOTH EXTRACTION     Family History: family history includes Cancer in her paternal grandfather; Diabetes in her paternal grandmother; Heart disease in her father, maternal grandfather, paternal grandfather, and paternal uncle; Stroke in her maternal grandmother, paternal grandfather, and paternal grandmother; Thyroid disease in her paternal grandmother. Social History:  reports that she has never smoked. She has never used smokeless tobacco. She reports that she does not drink alcohol or use drugs.     Maternal Diabetes: No Genetic Screening: Normal Maternal Ultrasounds/Referrals: Normal Fetal Ultrasounds or other Referrals:  None Maternal Substance Abuse:  No Significant Maternal Medications:  None Significant Maternal Lab Results:  None Other Comments:  None  Review of Systems  Eyes: Negative for blurred vision.  Neurological: Negative for headaches.   Maternal Medical History:  Reason for admission: Contractions.   Contractions: Perceived severity is strong.    Fetal activity: Perceived fetal activity is normal.      Dilation: 9 Effacement (%): 90 Station: -1 Exam by:: Flossie BuffyV. Rodgers RN Last menstrual period 11/14/2015, unknown if currently breastfeeding. Maternal Exam:  Uterine Assessment: Contraction strength is firm.  Contraction frequency is regular.    Abdomen: Fetal presentation: vertex     Fetal Exam Fetal State Assessment: Category I - tracings are normal.     Physical Exam  Cardiovascular: Normal rate and regular rhythm.   Respiratory: Effort normal and breath sounds normal.  GI: Soft.  Neurological: She has normal reflexes.    Cx 9/C/0/vtx per nurse check  Prenatal labs: ABO, Rh: --/--/O POS (10/17 16100115) Antibody: PENDING (10/17 0115) Rubella: Immune (03/23 0000) RPR: Nonreactive (03/23 0000)  HBsAg: Negative (03/23 0000)  HIV: Non-reactive (03/23 0000)  GBS: Negative (09/25 0000)   Assessment/Plan: 35 yo G2P1 in active labor D/W patient options risks She wants vagnal delivery   Ivon Oelkers II,Minoru Chap E 08/13/2016, 1:40 AM

## 2016-08-13 NOTE — Lactation Note (Signed)
This note was copied from a baby's chart. Lactation Consultation Note  Patient Name: Amber Downs ZOXWR'UToday's Date: 08/13/2016 Reason for consult: Initial assessment Baby at 13 hr of life. Mom stated she has flat nipples and used a NS for 6 m with her older child. She denies breast or nipple pain, voiced no concerns. Baby was sleeping sts with mom and visitor came in during consult, breast were not assessed. Mom instructed to call for lactation at next feeding. She seemed uninterested in lactation help. Discussed baby behavior, feeding frequency, post pumping with NS, baby belly size, voids, wt loss, breast changes, and nipple care. Mom stated she can manually express, she does not have a spoon but will call for one if needed. Given lactation handouts. Aware of OP services and support group.    Maternal Data Has patient been taught Hand Expression?: Yes Does the patient have breastfeeding experience prior to this delivery?: Yes  Feeding Feeding Type: Breast Fed  LATCH Score/Interventions Latch: Grasps breast easily, tongue down, lips flanged, rhythmical sucking.  Audible Swallowing: None Intervention(s): Skin to skin Intervention(s): Skin to skin  Type of Nipple: Everted at rest and after stimulation (short shaft) Intervention(s): Hand pump  Comfort (Breast/Nipple): Soft / non-tender     Hold (Positioning): Assistance needed to correctly position infant at breast and maintain latch.  LATCH Score: 7  Lactation Tools Discussed/Used Tools: Other (comment) ([employer]) Nipple shield size: 24 WIC Program: No   Consult Status Consult Status: Follow-up Date: 08/14/16 Follow-up type: In-patient    Rulon Eisenmengerlizabeth E Janequa Kipnis 08/13/2016, 3:38 PM

## 2016-08-13 NOTE — MAU Note (Signed)
Pt presents complaining of contractions since 1800. States they are every 2-3 minutes. Denies leaking or bleeding. Reports good fetal movement. Scheduled for repeat c/s. 1-2cm in office last week.

## 2016-08-14 ENCOUNTER — Encounter (HOSPITAL_COMMUNITY)
Admission: RE | Admit: 2016-08-14 | Discharge: 2016-08-14 | Disposition: A | Discharge status: Home / Self Care | Payer: Self-pay | Source: Ambulatory Visit

## 2016-08-14 HISTORY — DX: Headache: R51

## 2016-08-14 HISTORY — DX: Headache, unspecified: R51.9

## 2016-08-14 MED ORDER — IBUPROFEN 600 MG PO TABS: 600.0000 mg | ORAL_TABLET | Freq: Four times a day (QID) | ORAL | 1 refills | Status: DC

## 2016-08-14 NOTE — Discharge Summary (Signed)
Obstetric Discharge Summary Reason for Admission: onset of labor Prenatal Procedures: ultrasound Intrapartum Procedures: spontaneous vaginal delivery Postpartum Procedures: none Complications-Operative and Postpartum: 2 degree perineal laceration Hemoglobin  Date Value Ref Range Status  08/13/2016 9.9 (L) 12.0 - 15.0 Downs/dL Final   HCT  Date Value Ref Range Status  08/13/2016 29.3 (L) 36.0 - 46.0 % Final    Physical Exam:  General: alert and cooperative Lochia: appropriate Uterine Fundus: firm Incision: healing well DVT Evaluation: No evidence of DVT seen on physical exam. Negative Homan's sign. No cords or calf tenderness. No significant calf/ankle edema.  Discharge Diagnoses: Term Pregnancy-delivered  Discharge Information: Date: 08/14/2016 Activity: pelvic rest Diet: routine Medications: PNV and Ibuprofen Condition: stable Instructions: refer to practice specific booklet Discharge to: home   Newborn Data: Live born female  Birth Weight: 6 lb 4.9 oz (2860 Downs) APGAR: 8, 9  Home with mother.  Amber Downs 08/14/2016, 8:03 AM

## 2016-08-15 ENCOUNTER — Inpatient Hospital Stay (HOSPITAL_COMMUNITY): Admission: AD | Admit: 2016-08-15 | Payer: Self-pay | Source: Ambulatory Visit | Admitting: Obstetrics and Gynecology

## 2016-08-15 SURGERY — Surgical Case
Anesthesia: Regional

## 2019-03-29 ENCOUNTER — Other Ambulatory Visit: Payer: Self-pay | Admitting: Obstetrics and Gynecology

## 2019-03-29 DIAGNOSIS — R928 Other abnormal and inconclusive findings on diagnostic imaging of breast: Secondary | ICD-10-CM

## 2019-03-31 ENCOUNTER — Ambulatory Visit
Admission: RE | Admit: 2019-03-31 | Discharge: 2019-03-31 | Disposition: A | Discharge status: Home / Self Care | Payer: BC Managed Care – PPO | Source: Ambulatory Visit | Attending: Obstetrics and Gynecology | Admitting: Obstetrics and Gynecology

## 2019-03-31 ENCOUNTER — Ambulatory Visit
Admission: RE | Admit: 2019-03-31 | Discharge: 2019-03-31 | Disposition: A | Discharge status: Home / Self Care | Payer: BLUE CROSS/BLUE SHIELD | Source: Ambulatory Visit | Attending: Obstetrics and Gynecology | Admitting: Obstetrics and Gynecology

## 2019-03-31 ENCOUNTER — Other Ambulatory Visit: Payer: Self-pay

## 2019-03-31 DIAGNOSIS — R928 Other abnormal and inconclusive findings on diagnostic imaging of breast: Secondary | ICD-10-CM

## 2020-11-12 ENCOUNTER — Ambulatory Visit: Payer: Self-pay

## 2020-11-13 ENCOUNTER — Ambulatory Visit: Payer: Self-pay

## 2020-11-16 ENCOUNTER — Telehealth (INDEPENDENT_AMBULATORY_CARE_PROVIDER_SITE_OTHER): Payer: BC Managed Care – PPO | Admitting: Family Medicine

## 2020-11-16 ENCOUNTER — Other Ambulatory Visit: Payer: Self-pay

## 2020-11-16 ENCOUNTER — Ambulatory Visit: Payer: Self-pay

## 2020-11-16 ENCOUNTER — Encounter: Payer: Self-pay | Admitting: Family Medicine

## 2020-11-16 VITALS — Ht 64.0 in | Wt 192.0 lb

## 2020-11-16 DIAGNOSIS — U071 COVID-19: Secondary | ICD-10-CM | POA: Diagnosis not present

## 2020-11-16 MED ORDER — SPIRONOLACTONE 100 MG PO TABS: 100.0000 mg | ORAL_TABLET | Freq: Two times a day (BID) | ORAL | 0 refills | Status: DC

## 2020-11-16 MED ORDER — FINASTERIDE 5 MG PO TABS: 5.0000 mg | ORAL_TABLET | Freq: Every day | ORAL | 0 refills | Status: DC

## 2020-11-16 MED ORDER — PULMICORT FLEXHALER 180 MCG/ACT IN AEPB: 2.0000 | INHALATION_SPRAY | Freq: Two times a day (BID) | RESPIRATORY_TRACT | 1 refills | Status: DC

## 2020-11-16 MED ORDER — FLUOXETINE HCL 10 MG PO CAPS: 10.0000 mg | ORAL_CAPSULE | Freq: Every day | ORAL | 0 refills | Status: DC

## 2020-11-16 MED ORDER — PREDNISONE 20 MG PO TABS: ORAL_TABLET | ORAL | 0 refills | Status: DC

## 2020-11-16 NOTE — Progress Notes (Signed)
Patient: Amber Downs MRN: [id number] DOB: [birth year] PCP: Elfredia Nevins, MD     I connected with Tawny Hopping on 11/16/20 at 11:14am by a video enabled telemedicine application and verified that I am speaking with the correct person using two identifiers.  Location patient: Home Location provider: [facility], Office Persons participating in this virtual visit: Marye Round and Dr. Artis Flock   I discussed the limitations of evaluation and management by telemedicine and the availability of in person appointments. The patient expressed understanding and agreed to proceed.   Subjective:  Chief Complaint  Patient presents with  . Cough  . Fatigue  . Covid Positive    Symptoms started last Saturday.     HPI: The patient is a 40 y.o. female who presents today for positive Covid result on Monday 11/06/20. Started having symptoms on 1/8 with cough and fatigue. She got better x 48 hours and then she started to have fever and congested cough that progressively got worse over the weekend. She is overall healthy with no medication.. Her oxygen is staying around 90-93%. Her cough is continuous and she has diarrhea. Hard for her to talk due to cough. Unvaccinated. She is short of breath.   Review of Systems  Constitutional: Positive for fatigue.  HENT: Positive for congestion. Negative for sore throat.   Respiratory: Positive for cough and shortness of breath. Negative for chest tightness and wheezing.   Neurological: Positive for headaches. Negative for dizziness.    Allergies Patient has No Known Allergies.  Past Medical History Patient  has a past medical history of Headache and Medical history non-contributory.  Surgical History Patient  has a past surgical history that includes Appendectomy; Cesarean section (N/A, 04/06/2013); and Wisdom tooth extraction.  Family History Pateint's family history includes Breast cancer in her maternal grandmother and paternal grandmother; Cancer in  her paternal grandfather; Diabetes in her paternal grandmother; Heart disease in her father, maternal grandfather, paternal grandfather, and paternal uncle; Stroke in her maternal grandmother, paternal grandfather, and paternal grandmother; Thyroid disease in her paternal grandmother.  Social History Patient  reports that she has never smoked. She has never used smokeless tobacco. She reports that she does not drink alcohol and does not use drugs.    Objective: Vitals:   11/16/20 1025  Weight: 192 lb 0.3 oz (87.1 kg)  Height: 5\' 4"  (1.626 m)    Body mass index is 32.96 kg/m.  Physical Exam Vitals reviewed.  Constitutional:      General: She is not in acute distress.    Appearance: Normal appearance.  HENT:     Head: Normocephalic and atraumatic.  Pulmonary:     Comments: Coughing fits. No retractions, but can not not get full sentences out at times due to coughing. No increased work of breathing  Neurological:     General: No focal deficit present.     Mental Status: She is alert and oriented to person, place, and time.  Psychiatric:        Mood and Affect: Mood normal.        Behavior: Behavior normal.        Assessment/plan: 1. COVID-19 -outside of window for other treatments. Would consider her low risk.  -starting her on treatment nutraceutical bundle including zinc sulfate, vit D, vit C, quercetin and melatonin 5-15+ days depending on symptoms. She has already started this.  -iodine 1% nasal drop 1-2x/day -ivermectin X5 days. Discussed with family that this is still considered experimental; however, multiple studies (  over 71 at this point) have been done that have shown significant benefit with it's use and it's safety profile can not be beat. Side effects discussed. They would like to proceed.  -day 10 with worsening pulmonary stage. adding prozac 30mg /day, pulmicort, anti-androgens and oral steroids. High dose steroids with taper. Do not like that her oxygen is 90-93%.   Will follow closely. Oxygen less than 90% needs to go to ER.  -gargle mouthwash TID -outside as much as possible.  -pulse ox >93-94% -prone sleeping if possible.  -will check in daily.  -strict ER precautions given.    Return if symptoms worsen or fail to improve.    , MD [facility]  11/16/2020

## 2020-12-18 ENCOUNTER — Ambulatory Visit
Admission: EM | Admit: 2020-12-18 | Discharge: 2020-12-18 | Disposition: A | Discharge status: Home / Self Care | Payer: BC Managed Care – PPO

## 2020-12-18 ENCOUNTER — Other Ambulatory Visit: Payer: Self-pay

## 2020-12-18 ENCOUNTER — Encounter: Payer: Self-pay | Admitting: Emergency Medicine

## 2020-12-18 DIAGNOSIS — R1011 Right upper quadrant pain: Secondary | ICD-10-CM | POA: Diagnosis not present

## 2020-12-18 NOTE — ED Triage Notes (Signed)
abd pain right upper quadrant.  Nausea, no vomiting or diarrhea.  Pain started Friday.

## 2020-12-18 NOTE — ED Provider Notes (Addendum)
[facility]   [id number] 12/18/20 Arrival Time: 0954  CC: ABDOMINAL DISCOMFORT  SUBJECTIVE:  Amber Downs is a 40 y.o. female who presented to the urgent care for complaint of right upper abdominal pain, and nausea that started this past Friday.  Denies a precipitating event, or specific injury.  Patient localizes pain to right upper quadrant.  Describes it as constant and achy in character.  Has tried OTC medications without relief.  Denies alleviating or aggravating factors.  Denies similar symptoms in the past.  Denies fever, chills, appetite change, weight change, chest pain, nausea, vomiting, changes in bowel or bladder habits..     Patient's last menstrual period was 12/15/2020.  ROS: As per HPI.  All other pertinent ROS negative.     Past Medical History:  Diagnosis Date  . Headache   . Medical history non-contributory    Past Surgical History:  Procedure Laterality Date  . APPENDECTOMY    . CESAREAN SECTION N/A 04/06/2013   Procedure: CESAREAN SECTION;  Surgeon: Mitchel Honour, DO;  Location: [facility];  Service: Obstetrics;  Laterality: N/A;  Primary Cesarean Section Delivery Baby Girl @ 1803, Apgars  . WISDOM TOOTH EXTRACTION     No Known Allergies No current facility-administered medications on file prior to encounter.   Current Outpatient Medications on File Prior to Encounter  Medication Sig Dispense Refill  . acetaminophen (TYLENOL) 325 MG tablet Take 650 mg by mouth every 6 (six) hours as needed for moderate pain.    . budesonide (PULMICORT FLEXHALER) 180 MCG/ACT inhaler Inhale 2 puffs into the lungs in the morning and at bedtime. 1 each 1  . doxycycline (VIBRA-TABS) 100 MG tablet Take 100 mg by mouth 2 (two) times daily.    . finasteride (PROSCAR) 5 MG tablet Take 1 tablet (5 mg total) by mouth daily. X 10 days 10 tablet 0  . FLUoxetine (PROZAC) 10 MG capsule Take 1 capsule (10 mg total) by mouth daily. 10 capsule 0  . FLUoxetine (PROZAC) 20 MG capsule  Take 20 mg by mouth daily.    Marland Kitchen ibuprofen (ADVIL,MOTRIN) 600 MG tablet Take 1 tablet (600 mg total) by mouth every 6 (six) hours. 30 tablet 1  . Ibuprofen 200 MG CAPS Motrin    . Multiple Vitamin (MULTIVITAMIN PO) multivitamin    . predniSONE (DELTASONE) 20 MG tablet Take 60mg  daily x 4 days then 40mg  daily x 4 days then 20mg  daily x 4 days then 10mg  daily x 4 days 26 tablet 0  . Prenatal Vit-Fe Fumarate-FA (PRENATAL MULTIVITAMIN) TABS Take 1 tablet by mouth at bedtime.    spironolactone (ALDACTONE) 100 MG tablet Take 1 tablet (100 mg total) by mouth 2 (two) times daily. For 10 days only 20 tablet 0   Social History   Socioeconomic History  . Marital status: Married    Spouse name: Not on file  . Number of children: Not on file  . Years of education: Not on file  . Highest education level: Not on file  Occupational History  . Not on file  Tobacco Use  . Smoking status: Never Smoker  . Smokeless tobacco: Never Used  Substance and Sexual Activity  . Alcohol use: No  . Drug use: No  . Sexual activity: Yes  Other Topics Concern  . Not on file  Social History Narrative  . Not on file   Social Determinants of Health   Financial Resource Strain: Not on file  Food Insecurity: Not on file  Transportation Needs: Not on file  Physical Activity: Not on file  Stress: Not on file  Social Connections: Not on file  Intimate Partner Violence: Not on file   Family History  Problem Relation Age of Onset  . Heart disease Father   . Heart disease Paternal Uncle   . Stroke Maternal Grandmother   . Breast cancer Maternal Grandmother   . Heart disease Maternal Grandfather   . Diabetes Paternal Grandmother   . Thyroid disease Paternal Grandmother   . Stroke Paternal Grandmother   . Breast cancer Paternal Grandmother   . Heart disease Paternal Grandfather   . Stroke Paternal Grandfather   . Cancer Paternal Grandfather        colon     OBJECTIVE:  Vitals:   12/18/20 1031  BP:  123/77  Pulse: 84  Resp: 18  Temp: 97.7 F (36.5 C)  TempSrc: Oral  SpO2: 96%    Physical Exam Vitals and nursing note reviewed.  Constitutional:      General: She is not in acute distress.    Appearance: Normal appearance. She is normal weight. She is not ill-appearing, toxic-appearing or diaphoretic.  HENT:     Head: Normocephalic.  Cardiovascular:     Rate and Rhythm: Normal rate and regular rhythm.     Pulses: Normal pulses.     Heart sounds: Normal heart sounds. No murmur heard. No friction rub. No gallop.   Pulmonary:     Effort: Pulmonary effort is normal. No respiratory distress.     Breath sounds: Normal breath sounds. No stridor. No wheezing, rhonchi or rales.  Chest:     Chest wall: No tenderness.  Abdominal:     General: Abdomen is flat. Bowel sounds are normal. There is no distension.     Palpations: Abdomen is soft.     Tenderness: There is abdominal tenderness in the right upper quadrant. There is no right CVA tenderness or left CVA tenderness.  Neurological:     Mental Status: She is alert and oriented to person, place, and time.     LABS: No results found for this or any previous visit (from the past 24 hour(s)).  DIAGNOSTIC STUDIES: No results found.   ASSESSMENT & PLAN:  1. Abdominal pain, right upper quadrant     No orders of the defined types were placed in this encounter.  Patient is stable at discharge.  There is a tenderness on palpation of right upper quadrant.  She was advised to go to ER for further evaluation to rule out other abdominal disease process   Discharge instructions  Please go to ER for further evaluation to rule out other abdominal disease process  Reviewed expectations re: course of current medical issues. Questions answered. Outlined signs and symptoms indicating need for more acute intervention. Patient verbalized understanding. After Visit Summary given.   Durward Parcel, FNP 12/18/20 1125    Durward Parcel, FNP 12/18/20 1126

## 2020-12-18 NOTE — Discharge Instructions (Addendum)
Please go to ER for further evaluation.

## 2020-12-19 ENCOUNTER — Encounter: Payer: Self-pay | Admitting: General Surgery

## 2020-12-19 ENCOUNTER — Observation Stay (HOSPITAL_COMMUNITY)
Admission: EM | Admit: 2020-12-19 | Discharge: 2020-12-21 | Disposition: A | Discharge status: Home / Self Care | Payer: BC Managed Care – PPO | Attending: Surgery | Admitting: Surgery

## 2020-12-19 ENCOUNTER — Observation Stay: Admission: AD | Admit: 2020-12-19 | Payer: BC Managed Care – PPO | Source: Ambulatory Visit | Admitting: General Surgery

## 2020-12-19 ENCOUNTER — Other Ambulatory Visit: Payer: Self-pay

## 2020-12-19 ENCOUNTER — Encounter (HOSPITAL_COMMUNITY): Payer: Self-pay | Admitting: *Deleted

## 2020-12-19 ENCOUNTER — Emergency Department (HOSPITAL_COMMUNITY): Payer: BC Managed Care – PPO

## 2020-12-19 DIAGNOSIS — Z419 Encounter for procedure for purposes other than remedying health state, unspecified: Secondary | ICD-10-CM

## 2020-12-19 DIAGNOSIS — Z7951 Long term (current) use of inhaled steroids: Secondary | ICD-10-CM | POA: Diagnosis not present

## 2020-12-19 DIAGNOSIS — K810 Acute cholecystitis: Secondary | ICD-10-CM | POA: Diagnosis present

## 2020-12-19 DIAGNOSIS — Z9049 Acquired absence of other specified parts of digestive tract: Secondary | ICD-10-CM

## 2020-12-19 DIAGNOSIS — K8062 Calculus of gallbladder and bile duct with acute cholecystitis without obstruction: Principal | ICD-10-CM | POA: Diagnosis present

## 2020-12-19 DIAGNOSIS — Z79899 Other long term (current) drug therapy: Secondary | ICD-10-CM | POA: Insufficient documentation

## 2020-12-19 DIAGNOSIS — Z8616 Personal history of COVID-19: Secondary | ICD-10-CM

## 2020-12-19 DIAGNOSIS — R52 Pain, unspecified: Secondary | ICD-10-CM

## 2020-12-19 DIAGNOSIS — Z20822 Contact with and (suspected) exposure to covid-19: Secondary | ICD-10-CM | POA: Diagnosis not present

## 2020-12-19 DIAGNOSIS — R109 Unspecified abdominal pain: Secondary | ICD-10-CM | POA: Diagnosis present

## 2020-12-19 DIAGNOSIS — K8046 Calculus of bile duct with acute and chronic cholecystitis without obstruction: Principal | ICD-10-CM | POA: Insufficient documentation

## 2020-12-19 DIAGNOSIS — K8020 Calculus of gallbladder without cholecystitis without obstruction: Secondary | ICD-10-CM

## 2020-12-19 LAB — CBC
HCT: 40.8 % (ref 36.0–46.0)
Hemoglobin: 13.1 g/dL (ref 12.0–15.0)
MCH: 29 pg (ref 26.0–34.0)
MCHC: 32.1 g/dL (ref 30.0–36.0)
MCV: 90.3 fL (ref 80.0–100.0)
Platelets: 469 10*3/uL — ABNORMAL HIGH (ref 150–400)
RBC: 4.52 MIL/uL (ref 3.87–5.11)
RDW: 15 % (ref 11.5–15.5)
WBC: 5.9 10*3/uL (ref 4.0–10.5)
nRBC: 0 % (ref 0.0–0.2)

## 2020-12-19 LAB — URINALYSIS, ROUTINE W REFLEX MICROSCOPIC
Bilirubin Urine: NEGATIVE
Glucose, UA: NEGATIVE mg/dL
Ketones, ur: 20 mg/dL — AB
Nitrite: NEGATIVE
Protein, ur: 30 mg/dL — AB
RBC / HPF: >50 RBC/hpf — ABNORMAL HIGH (ref 0–5)
Specific Gravity, Urine: 1.018 (ref 1.005–1.030)
pH: 5 (ref 5.0–8.0)

## 2020-12-19 LAB — COMPREHENSIVE METABOLIC PANEL
ALT: 424 U/L — ABNORMAL HIGH (ref 0–44)
AST: 174 U/L — ABNORMAL HIGH (ref 15–41)
Albumin: 4.2 g/dL (ref 3.5–5.0)
Alkaline Phosphatase: 161 U/L — ABNORMAL HIGH (ref 38–126)
Anion gap: 11 (ref 5–15)
BUN: 7 mg/dL (ref 6–20)
CO2: 22 mmol/L (ref 22–32)
Calcium: 9.3 mg/dL (ref 8.9–10.3)
Chloride: 106 mmol/L (ref 98–111)
Creatinine, Ser: 0.65 mg/dL (ref 0.44–1.00)
GFR, Estimated: >60 mL/min (ref >60)
Glucose, Bld: 92 mg/dL (ref 70–99)
Potassium: 3.4 mmol/L — ABNORMAL LOW (ref 3.5–5.1)
Sodium: 139 mmol/L (ref 135–145)
Total Bilirubin: 1.5 mg/dL — ABNORMAL HIGH (ref 0.3–1.2)
Total Protein: 7.7 g/dL (ref 6.5–8.1)

## 2020-12-19 LAB — RESP PANEL BY RT-PCR (FLU A&B, COVID) ARPGX2
Influenza A by PCR: NEGATIVE
Influenza B by PCR: NEGATIVE
SARS Coronavirus 2 by RT PCR: NEGATIVE

## 2020-12-19 LAB — I-STAT BETA HCG BLOOD, ED (MC, WL, AP ONLY): I-stat hCG, quantitative: <5 m[IU]/mL (ref <5)

## 2020-12-19 LAB — LIPASE, BLOOD: Lipase: 25 U/L (ref 11–51)

## 2020-12-19 MED ORDER — ONDANSETRON HCL 4 MG/2ML IJ SOLN: 4.0000 mg | Freq: Four times a day (QID) | INTRAMUSCULAR | Status: DC | PRN

## 2020-12-19 MED ORDER — POTASSIUM CHLORIDE IN NACL 20-0.45 MEQ/L-% IV SOLN
INTRAVENOUS | Status: DC
  Filled 2020-12-19 (×4): qty 1000

## 2020-12-19 MED ORDER — ENOXAPARIN SODIUM 40 MG/0.4ML ~~LOC~~ SOLN
40.0000 mg | SUBCUTANEOUS | Status: DC
  Administered 2020-12-19 – 2020-12-20 (×2): 40 mg via SUBCUTANEOUS
  Filled 2020-12-19 (×2): qty 0.4

## 2020-12-19 MED ORDER — CEFAZOLIN SODIUM-DEXTROSE 2-4 GM/100ML-% IV SOLN
2.0000 g | INTRAVENOUS | Status: AC
  Administered 2020-12-20: 2 g via INTRAVENOUS
  Filled 2020-12-19 (×2): qty 100

## 2020-12-19 MED ORDER — MORPHINE SULFATE (PF) 2 MG/ML IV SOLN
2.0000 mg | INTRAVENOUS | Status: DC | PRN
  Filled 2020-12-19: qty 1

## 2020-12-19 MED ORDER — METOPROLOL TARTRATE 5 MG/5ML IV SOLN: 5.0000 mg | Freq: Four times a day (QID) | INTRAVENOUS | Status: DC | PRN

## 2020-12-19 MED ORDER — SIMETHICONE 80 MG PO CHEW
40.0000 mg | CHEWABLE_TABLET | Freq: Four times a day (QID) | ORAL | Status: DC | PRN
Start: 2020-12-19 — End: 2020-12-21
  Filled 2020-12-19: qty 1

## 2020-12-19 MED ORDER — CELECOXIB 200 MG PO CAPS
400.0000 mg | ORAL_CAPSULE | ORAL | Status: AC
  Administered 2020-12-20: 400 mg via ORAL
  Filled 2020-12-19: qty 2

## 2020-12-19 MED ORDER — ACETAMINOPHEN 325 MG PO TABS
650.0000 mg | ORAL_TABLET | Freq: Four times a day (QID) | ORAL | Status: DC | PRN
  Administered 2020-12-19 – 2020-12-20 (×2): 650 mg via ORAL
  Filled 2020-12-19 (×2): qty 2

## 2020-12-19 MED ORDER — DIPHENHYDRAMINE HCL 50 MG/ML IJ SOLN
25.0000 mg | Freq: Four times a day (QID) | INTRAMUSCULAR | Status: DC | PRN
Start: 2020-12-19 — End: 2020-12-21

## 2020-12-19 MED ORDER — SODIUM CHLORIDE 0.45 % IV SOLN: INTRAVENOUS | Status: DC

## 2020-12-19 MED ORDER — ACETAMINOPHEN 650 MG RE SUPP: 650.0000 mg | Freq: Four times a day (QID) | RECTAL | Status: DC | PRN

## 2020-12-19 MED ORDER — ONDANSETRON 4 MG PO TBDP: 4.0000 mg | ORAL_TABLET | Freq: Four times a day (QID) | ORAL | Status: DC | PRN

## 2020-12-19 MED ORDER — GABAPENTIN 300 MG PO CAPS
300.0000 mg | ORAL_CAPSULE | ORAL | Status: AC
  Administered 2020-12-20: 300 mg via ORAL
  Filled 2020-12-19: qty 1

## 2020-12-19 MED ORDER — ACETAMINOPHEN 500 MG PO TABS
1000.0000 mg | ORAL_TABLET | ORAL | Status: AC
  Administered 2020-12-20: 1000 mg via ORAL
  Filled 2020-12-19: qty 2

## 2020-12-19 MED ORDER — SODIUM CHLORIDE 0.9 % IV BOLUS
1000.0000 mL | Freq: Once | INTRAVENOUS | Status: AC
  Administered 2020-12-19: 1000 mL via INTRAVENOUS

## 2020-12-19 MED ORDER — METHOCARBAMOL 500 MG PO TABS
500.0000 mg | ORAL_TABLET | Freq: Four times a day (QID) | ORAL | Status: DC | PRN
  Administered 2020-12-19: 23:00:00 500 mg via ORAL

## 2020-12-19 MED ORDER — POLYETHYLENE GLYCOL 3350 17 G PO PACK: 17.0000 g | PACK | Freq: Every day | ORAL | Status: DC | PRN

## 2020-12-19 MED ORDER — OXYCODONE HCL 5 MG PO TABS
5.0000 mg | ORAL_TABLET | ORAL | Status: DC | PRN
  Administered 2020-12-19 – 2020-12-20 (×5): 10 mg via ORAL
  Filled 2020-12-19 (×5): qty 2

## 2020-12-19 MED ORDER — DIPHENHYDRAMINE HCL 25 MG PO CAPS: 25.0000 mg | ORAL_CAPSULE | Freq: Four times a day (QID) | ORAL | Status: DC | PRN

## 2020-12-19 NOTE — ED Triage Notes (Signed)
Abd pain started Friday upper mid abd then to Rt upper quad, back pain, yesterday went to UC. Ordered self order labs yesterday with noted increased LFT's.

## 2020-12-19 NOTE — ED Provider Notes (Signed)
[facility]-EMERGENCY DEPT Provider Note   CSN: [id number] Arrival date & time: 12/19/20  1223     History Chief Complaint  Patient presents with  . Abdominal Pain    Amber Downs is a 40 y.o. female.  Pt presents to the ED today with abdominal pain.  Pt said she has had pain since Friday, 2/18.  She did go to UC yesterday who told her to go to the ED.  [facility] was aware of patient and tried to do a direct admit, but there were not any beds, so they told her to come to the ED.        Past Medical History:  Diagnosis Date  . Headache   . Medical history non-contributory     Patient Active Problem List   Diagnosis Date Noted  . Symptomatic cholelithiasis 12/19/2020  . Normal labor and delivery 08/13/2016  . VBAC, delivered 08/13/2016    Past Surgical History:  Procedure Laterality Date  . APPENDECTOMY    . CESAREAN SECTION N/A 04/06/2013   Procedure: CESAREAN SECTION;  Surgeon: Mitchel Honour, DO;  Location: [facility];  Service: Obstetrics;  Laterality: N/A;  Primary Cesarean Section Delivery Baby Girl @ 1803, Apgars  . WISDOM TOOTH EXTRACTION       OB History    Gravida  2   Para  2   Term  2   Preterm      AB      Living  2     SAB      IAB      Ectopic      Multiple  0   Live Births  2           Family History  Problem Relation Age of Onset  . Heart disease Father   . Heart disease Paternal Uncle   . Stroke Maternal Grandmother   . Breast cancer Maternal Grandmother   . Heart disease Maternal Grandfather   . Diabetes Paternal Grandmother   . Thyroid disease Paternal Grandmother   . Stroke Paternal Grandmother   . Breast cancer Paternal Grandmother   . Heart disease Paternal Grandfather   . Stroke Paternal Grandfather   . Cancer Paternal Grandfather        colon    Social History   Tobacco Use  . Smoking status: Never Smoker  . Smokeless tobacco: Never Used  Substance Use Topics  . Alcohol use: No   . Drug use: No    Home Medications Prior to Admission medications   Medication Sig Start Date End Date Taking? Authorizing Provider  acetaminophen (TYLENOL) 325 MG tablet Take 650 mg by mouth every 6 (six) hours as needed for moderate pain.   Yes [provider]  Ibuprofen 200 MG CAPS Take 2 capsules by mouth daily as needed (pain).   Yes [provider]  melatonin 5 MG TABS Take 5 mg by mouth at bedtime.   Yes [provider]  budesonide (PULMICORT FLEXHALER) 180 MCG/ACT inhaler Inhale 2 puffs into the lungs in the morning and at bedtime. Patient taking differently: Inhale 2 puffs into the lungs 2 (two) times daily as needed (sob). 11/16/20   Orland Mustard, MD  doxycycline (VIBRA-TABS) 100 MG tablet Take 100 mg by mouth 2 (two) times daily. Patient not taking: Reported on 12/19/2020 11/13/20   [provider]  finasteride (PROSCAR) 5 MG tablet Take 1 tablet (5 mg total) by mouth daily. X 10 days Patient not taking:  Reported on 12/19/2020 11/16/20   Orland MustardWolfe, Allison, MD  FLUoxetine (PROZAC) 10 MG capsule Take 1 capsule (10 mg total) by mouth daily. Patient not taking: Reported on 12/19/2020 11/16/20   Orland MustardWolfe, Allison, MD  ibuprofen (ADVIL,MOTRIN) 600 MG tablet Take 1 tablet (600 mg total) by mouth every 6 (six) hours. Patient not taking: Reported on 12/19/2020 08/14/16   Julio Sicksurtis, Carol, NP  ipratropium-albuterol (DUONEB) 0.5-2.5 (3) MG/3ML SOLN Take 3 mLs by nebulization every 6 (six) hours as needed (sob/wheezing). 11/17/20   [provider]  predniSONE (DELTASONE) 20 MG tablet Take 60mg  daily x 4 days then 40mg  daily x 4 days then 20mg  daily x 4 days then 10mg  daily x 4 days Patient not taking: Reported on 12/19/2020 11/16/20   Orland MustardWolfe, Allison, MD  spironolactone (ALDACTONE) 100 MG tablet Take 1 tablet (100 mg total) by mouth 2 (two) times daily. For 10 days only Patient not taking: Reported on 12/19/2020 11/16/20   Orland MustardWolfe, Allison, MD    Allergies    Patient  has no known allergies.  Review of Systems   Review of Systems  Gastrointestinal: Positive for abdominal pain.  All other systems reviewed and are negative.   Physical Exam Updated Vital Signs BP 117/85   Pulse 77   Temp 98 F (36.7 C) (Oral)   Resp (!) 21   Ht 5\' 3"  (1.6 m)   Wt 88.5 kg   LMP 12/15/2020   SpO2 98%   BMI 34.54 kg/m   Physical Exam Vitals and nursing note reviewed.  Constitutional:      Appearance: She is well-developed.  HENT:     Head: Normocephalic and atraumatic.     Mouth/Throat:     Mouth: Mucous membranes are moist.     Pharynx: Oropharynx is clear.  Eyes:     Extraocular Movements: Extraocular movements intact.     Pupils: Pupils are equal, round, and reactive to light.  Cardiovascular:     Rate and Rhythm: Normal rate and regular rhythm.  Abdominal:     General: Abdomen is flat. Bowel sounds are normal.     Palpations: Abdomen is soft.     Tenderness: There is abdominal tenderness in the right upper quadrant.  Skin:    General: Skin is warm.     Capillary Refill: Capillary refill takes less than 2 seconds.  Neurological:     General: No focal deficit present.     Mental Status: She is alert and oriented to person, place, and time.  Psychiatric:        Mood and Affect: Mood normal.        Behavior: Behavior normal.     ED Results / Procedures / Treatments   Labs (all labs ordered are listed, but only abnormal results are displayed) Labs Reviewed  COMPREHENSIVE METABOLIC PANEL - Abnormal; Notable for the following components:      Result Value   Potassium 3.4 (*)    AST 174 (*)    ALT 424 (*)    Alkaline Phosphatase 161 (*)    Total Bilirubin 1.5 (*)    All other components within normal limits  CBC - Abnormal; Notable for the following components:   Platelets 469 (*)    All other components within normal limits  URINALYSIS, ROUTINE W REFLEX MICROSCOPIC - Abnormal; Notable for the following components:   Color, Urine AMBER (*)     Hgb urine dipstick LARGE (*)    Ketones, ur 20 (*)    Protein, ur  30 (*)    Leukocytes,Ua MODERATE (*)    RBC / HPF >50 (*)    Bacteria, UA FEW (*)    Crystals PRESENT (*)    All other components within normal limits  RESP PANEL BY RT-PCR (FLU A&B, COVID) ARPGX2  LIPASE, BLOOD  HIV ANTIBODY (ROUTINE TESTING W REFLEX)  I-STAT BETA HCG BLOOD, ED ([facility], [facility], [facility] ONLY)    EKG None  Radiology US Abdomen Limited RUQ (LIVER/GB)  Result Date: 12/19/2020 CLINICAL DATA:  Abdominal pain, no prior surgeries reported in this 41 year old female. EXAM: ULTRASOUND ABDOMEN LIMITED RIGHT UPPER QUADRANT COMPARISON:  Remote study from 2007 FINDINGS: Gallbladder: Numerous gallstones in the gallbladder lumen, mobile the largest approximately 1.5 cm. No gallbladder wall thickening or pericholecystic fluid. No reported tenderness over the gallbladder. Common bile duct: Diameter: 5.2 mm Liver: Echogenic liver, limited assessment overall due to patient body habitus and hepatic echogenicity. Portal vein is patent on color Doppler imaging with normal direction of blood flow towards the liver. Other: None. IMPRESSION: Cholelithiasis without associated sonographic signs to suggest acute cholecystitis at this time. Top normal size of the common bile duct. Correlate with hepatic enzymes. Electronically Signed   By: Donzetta Kohut M.D.   On: 12/19/2020 14:31    Procedures Procedures   Medications Ordered in ED Medications  enoxaparin (LOVENOX) injection 40 mg (has no administration in time range)  metoprolol tartrate (LOPRESSOR) injection 5 mg (has no administration in time range)  simethicone (MYLICON) chewable tablet 40 mg (has no administration in time range)  ondansetron (ZOFRAN-ODT) disintegrating tablet 4 mg (has no administration in time range)    Or  ondansetron (ZOFRAN) injection 4 mg (has no administration in time range)  polyethylene glycol (MIRALAX / GLYCOLAX) packet 17 g (has no administration in time  range)  diphenhydrAMINE (BENADRYL) capsule 25 mg (has no administration in time range)    Or  diphenhydrAMINE (BENADRYL) injection 25 mg (has no administration in time range)  methocarbamol (ROBAXIN) tablet 500 mg (has no administration in time range)  morphine 2 MG/ML injection 2 mg (has no administration in time range)  oxyCODONE (Oxy IR/ROXICODONE) immediate release tablet 5-10 mg (has no administration in time range)  acetaminophen (TYLENOL) tablet 650 mg (has no administration in time range)    Or  acetaminophen (TYLENOL) suppository 650 mg (has no administration in time range)  gabapentin (NEURONTIN) capsule 300 mg (has no administration in time range)  acetaminophen (TYLENOL) tablet 1,000 mg (has no administration in time range)  celecoxib (CELEBREX) capsule 400 mg (has no administration in time range)  ceFAZolin (ANCEF) IVPB 2g/100 mL premix (has no administration in time range)  sodium chloride 0.45 % 1,000 mL with potassium chloride 20 mEq infusion (has no administration in time range)  sodium chloride 0.9 % bolus 1,000 mL (1,000 mLs Intravenous Bolus 12/19/20 1422)    ED Course  I have reviewed the triage vital signs and the nursing notes.  Pertinent labs & imaging results that were available during my care of the patient were reviewed by me and considered in my medical decision making (see chart for details).    MDM Rules/Calculators/A&P                           Urine does show some blood which is because she is on her period.    Pt did have a [place] which showed chloelithiasis. CBD is 0.5 cm.  LFTs mildly elevated since yesterday.  Pt d/w surgery who will admit for pain control.   Final Clinical Impression(s) / ED Diagnoses Final diagnoses:  Pain  Calculus of gallbladder without cholecystitis without obstruction    Rx / DC Orders ED Discharge Orders    None       Jacalyn Lefevre, MD 12/19/20 [phone]

## 2020-12-19 NOTE — H&P (Signed)
Admitting Physician: Hyman Hopes Ramsha Lonigro  Service: General surgery  CC: Abdominal pain  Subjective   HPI: Amber Downs is an 40 y.o. female who is here for abdominal pain.  Her pain started on Friday and was initially epigastric and then started to radiate to her right upper quadrant and into her back.  Her child was sick with a stomach bug so she thought it may be due to the stomach bug.  Yesterday she got some outpatient LFTs which were elevated, so today she presented to the emergency department to be evaluated.    Past Medical History:  Diagnosis Date  . Headache   . Medical history non-contributory     Past Surgical History:  Procedure Laterality Date  . APPENDECTOMY    . CESAREAN SECTION N/A 04/06/2013   Procedure: CESAREAN SECTION;  Surgeon: Mitchel Honour, DO;  Location: [facility];  Service: Obstetrics;  Laterality: N/A;  Primary Cesarean Section Delivery Baby Girl @ 1803, Apgars  . WISDOM TOOTH EXTRACTION      Family History  Problem Relation Age of Onset  . Heart disease Father   . Heart disease Paternal Uncle   . Stroke Maternal Grandmother   . Breast cancer Maternal Grandmother   . Heart disease Maternal Grandfather   . Diabetes Paternal Grandmother   . Thyroid disease Paternal Grandmother   . Stroke Paternal Grandmother   . Breast cancer Paternal Grandmother   . Heart disease Paternal Grandfather   . Stroke Paternal Grandfather   . Cancer Paternal Grandfather        colon    Social:  reports that she has never smoked. She has never used smokeless tobacco. She reports that she does not drink alcohol and does not use drugs.  Allergies: No Known Allergies  Medications: Current Outpatient Medications  Medication Instructions  . acetaminophen (TYLENOL) 650 mg, Oral, Every 6 hours PRN  . budesonide (PULMICORT FLEXHALER) 180 MCG/ACT inhaler 2 puffs, Inhalation, 2 times daily  . doxycycline (VIBRA-TABS) 100 mg, Oral, 2 times daily  . finasteride (PROSCAR) 5  mg, Oral, Daily, X 10 days  . FLUoxetine (PROZAC) 20 mg, Oral, Daily  . FLUoxetine (PROZAC) 10 mg, Oral, Daily  . ibuprofen (ADVIL) 600 mg, Oral, Every 6 hours  . Ibuprofen 200 MG CAPS Motrin  . Multiple Vitamin (MULTIVITAMIN PO) multivitamin  . predniSONE (DELTASONE) 20 MG tablet Take 60mg  daily x 4 days then 40mg  daily x 4 days then 20mg  daily x 4 days then 10mg  daily x 4 days  . Prenatal Vit-Fe Fumarate-FA (PRENATAL MULTIVITAMIN) TABS 1 tablet, Daily at bedtime  . spironolactone (ALDACTONE) 100 mg, Oral, 2 times daily, For 10 days only    ROS - all of the below systems have been reviewed with the patient and positives are indicated with bold text General: chills, fever or night sweats Eyes: blurry vision or double vision ENT: epistaxis or sore throat Allergy/Immunology: itchy/watery eyes or nasal congestion Hematologic/Lymphatic: bleeding problems, blood clots or swollen lymph nodes Endocrine: temperature intolerance or unexpected weight changes Breast: new or changing breast lumps or nipple discharge Resp: cough, shortness of breath, or wheezing CV: chest pain or dyspnea on exertion GI: as per HPI GU: dysuria, trouble voiding, or hematuria MSK: joint pain or joint stiffness Neuro: TIA or stroke symptoms Derm: pruritus and skin lesion changes Psych: anxiety and depression  Objective   PE Blood pressure 117/85, pulse 77, temperature 98 F (36.7 C), temperature source Oral, resp. rate (!) 21, height 5\' 3"  (  1.6 m), weight 88.5 kg, last menstrual period 12/15/2020, SpO2 98 %, unknown if currently breastfeeding. Constitutional: NAD; conversant; no deformities Eyes: Moist conjunctiva; no lid lag; anicteric; PERRL Neck: Trachea midline; no thyromegaly Lungs: Normal respiratory effort; no tactile fremitus CV: RRR; no palpable thrills; no pitting edema GI: Abd soft, minimally tender to palpation right upper quadrant, negative Murphy sign; no palpable hepatosplenomegaly MSK: Normal  range of motion of extremities; no clubbing/cyanosis Psychiatric: Appropriate affect; alert and oriented x3 Lymphatic: No palpable cervical or axillary lymphadenopathy  No results found for this or any previous visit (from the past 24 hour(s)).   Imaging Orders     US Abdomen Limited RUQ (LIVER/GB)   Assessment and Plan   Amber Downs is an 40 y.o. female with right upper quadrant abdominal pain due to cholecystitis.  She had some mild elevation in her LFTs yesterday, somewhat concerning for choledocholithiasis, however her common bile duct measures 0.5 cm on right quadrant ultrasound.  We will follow up her LFTs today to decide if she needs further work-up of her duct prior to surgery versus just proceeding with an intraoperative cholangiogram.  We will tentatively plan for laparoscopic cholecystectomy with intraoperative cholangiogram tomorrow morning.  The procedure itself as well as its risk, benefits, and alternatives were discussed with the patient in full who was granted consent to proceed.  The risk discussed included but not limited to the risk of infection, bleeding, damage nearby structures, bile leak, and common bile duct injury.  She will be admitted the surgery service, kept n.p.o. overnight, and provided pain and nausea medication as needed.   Quentin Ore, MD  [facility] Use [email or web address] to contact on call provider

## 2020-12-19 NOTE — ED Notes (Signed)
Report called to [facility], Charity fundraiser.

## 2020-12-20 ENCOUNTER — Observation Stay (HOSPITAL_COMMUNITY): Payer: BC Managed Care – PPO | Admitting: Certified Registered"

## 2020-12-20 ENCOUNTER — Encounter (HOSPITAL_COMMUNITY)
Admission: EM | Disposition: A | Discharge status: Home / Self Care | Payer: Self-pay | Source: Home / Self Care | Attending: Emergency Medicine

## 2020-12-20 ENCOUNTER — Observation Stay (HOSPITAL_COMMUNITY): Payer: BC Managed Care – PPO

## 2020-12-20 HISTORY — PX: CHOLECYSTECTOMY: SHX55

## 2020-12-20 LAB — HIV ANTIBODY (ROUTINE TESTING W REFLEX): HIV Screen 4th Generation wRfx: NONREACTIVE

## 2020-12-20 SURGERY — LAPAROSCOPIC CHOLECYSTECTOMY WITH INTRAOPERATIVE CHOLANGIOGRAM
Anesthesia: General

## 2020-12-20 MED ORDER — DEXAMETHASONE SODIUM PHOSPHATE 10 MG/ML IJ SOLN
INTRAMUSCULAR | Status: AC
  Filled 2020-12-20: qty 1

## 2020-12-20 MED ORDER — SCOPOLAMINE 1 MG/3DAYS TD PT72
1.0000 | MEDICATED_PATCH | TRANSDERMAL | Status: DC
  Administered 2020-12-20: 1.5 mg via TRANSDERMAL

## 2020-12-20 MED ORDER — LIDOCAINE HCL (PF) 2 % IJ SOLN
INTRAMUSCULAR | Status: AC
  Filled 2020-12-20: qty 5

## 2020-12-20 MED ORDER — HYDROMORPHONE HCL 1 MG/ML IJ SOLN
INTRAMUSCULAR | Status: AC
  Administered 2020-12-20: 0.5 mg via INTRAVENOUS
  Filled 2020-12-20: qty 1

## 2020-12-20 MED ORDER — ONDANSETRON HCL 4 MG/2ML IJ SOLN
INTRAMUSCULAR | Status: DC | PRN
  Administered 2020-12-20: 4 mg via INTRAVENOUS

## 2020-12-20 MED ORDER — PROPOFOL 10 MG/ML IV BOLUS
INTRAVENOUS | Status: AC
  Filled 2020-12-20: qty 20

## 2020-12-20 MED ORDER — ROCURONIUM BROMIDE 10 MG/ML (PF) SYRINGE
PREFILLED_SYRINGE | INTRAVENOUS | Status: AC
  Filled 2020-12-20: qty 10

## 2020-12-20 MED ORDER — PROPOFOL 10 MG/ML IV BOLUS
INTRAVENOUS | Status: DC | PRN
  Administered 2020-12-20: 200 mg via INTRAVENOUS

## 2020-12-20 MED ORDER — LIDOCAINE 2% (20 MG/ML) 5 ML SYRINGE
INTRAMUSCULAR | Status: DC | PRN
  Administered 2020-12-20: 40 mg via INTRAVENOUS

## 2020-12-20 MED ORDER — HYDROMORPHONE HCL 1 MG/ML IJ SOLN
INTRAMUSCULAR | Status: AC
  Filled 2020-12-20: qty 1

## 2020-12-20 MED ORDER — MIDAZOLAM HCL 2 MG/2ML IJ SOLN
INTRAMUSCULAR | Status: DC | PRN
  Administered 2020-12-20: 2 mg via INTRAVENOUS

## 2020-12-20 MED ORDER — BUPIVACAINE-EPINEPHRINE (PF) 0.25% -1:200000 IJ SOLN
INTRAMUSCULAR | Status: AC
  Filled 2020-12-20: qty 30

## 2020-12-20 MED ORDER — FENTANYL CITRATE (PF) 100 MCG/2ML IJ SOLN
INTRAMUSCULAR | Status: AC
  Filled 2020-12-20: qty 2

## 2020-12-20 MED ORDER — OXYCODONE-ACETAMINOPHEN 5-325 MG PO TABS: 1.0000 | ORAL_TABLET | ORAL | 0 refills | Status: AC | PRN

## 2020-12-20 MED ORDER — FENTANYL CITRATE (PF) 100 MCG/2ML IJ SOLN
INTRAMUSCULAR | Status: DC | PRN
  Administered 2020-12-20 (×2): 50 ug via INTRAVENOUS

## 2020-12-20 MED ORDER — IOHEXOL 300 MG/ML  SOLN
INTRAMUSCULAR | Status: DC | PRN
  Administered 2020-12-20: 20 mL

## 2020-12-20 MED ORDER — MIDAZOLAM HCL 2 MG/2ML IJ SOLN
INTRAMUSCULAR | Status: AC
  Filled 2020-12-20: qty 2

## 2020-12-20 MED ORDER — ONDANSETRON HCL 4 MG/2ML IJ SOLN
INTRAMUSCULAR | Status: AC
  Filled 2020-12-20: qty 2

## 2020-12-20 MED ORDER — SUCCINYLCHOLINE CHLORIDE 200 MG/10ML IV SOSY
PREFILLED_SYRINGE | INTRAVENOUS | Status: DC | PRN
  Administered 2020-12-20: 100 mg via INTRAVENOUS

## 2020-12-20 MED ORDER — MORPHINE SULFATE (PF) 2 MG/ML IV SOLN
2.0000 mg | INTRAVENOUS | Status: DC | PRN
  Administered 2020-12-21 (×2): 2 mg via INTRAVENOUS
  Filled 2020-12-20 (×2): qty 1

## 2020-12-20 MED ORDER — SODIUM CHLORIDE 0.9 % IR SOLN
Status: DC | PRN
  Administered 2020-12-20: 1000 mL

## 2020-12-20 MED ORDER — SODIUM CHLORIDE 0.9 % IV SOLN: INTRAVENOUS | Status: DC

## 2020-12-20 MED ORDER — HYDROMORPHONE HCL 1 MG/ML IJ SOLN
0.2500 mg | INTRAMUSCULAR | Status: DC | PRN
  Administered 2020-12-20 (×3): 0.5 mg via INTRAVENOUS

## 2020-12-20 MED ORDER — DEXAMETHASONE SODIUM PHOSPHATE 10 MG/ML IJ SOLN
INTRAMUSCULAR | Status: DC | PRN
  Administered 2020-12-20: 4 mg via INTRAVENOUS

## 2020-12-20 MED ORDER — ROCURONIUM BROMIDE 10 MG/ML (PF) SYRINGE
PREFILLED_SYRINGE | INTRAVENOUS | Status: DC | PRN
  Administered 2020-12-20: 40 mg via INTRAVENOUS

## 2020-12-20 MED ORDER — SUGAMMADEX SODIUM 200 MG/2ML IV SOLN
INTRAVENOUS | Status: DC | PRN
  Administered 2020-12-20: 200 mg via INTRAVENOUS

## 2020-12-20 MED ORDER — LACTATED RINGERS IV SOLN
INTRAVENOUS | Status: AC | PRN
  Administered 2020-12-20: 1000 mL

## 2020-12-20 MED ORDER — LACTATED RINGERS IV SOLN: INTRAVENOUS | Status: DC

## 2020-12-20 MED ORDER — GLUCAGON HCL RDNA (DIAGNOSTIC) 1 MG IJ SOLR
INTRAMUSCULAR | Status: AC
  Filled 2020-12-20: qty 2

## 2020-12-20 MED ORDER — SCOPOLAMINE 1 MG/3DAYS TD PT72
MEDICATED_PATCH | TRANSDERMAL | Status: AC
  Filled 2020-12-20: qty 1

## 2020-12-20 MED ORDER — BUPIVACAINE-EPINEPHRINE 0.25% -1:200000 IJ SOLN
INTRAMUSCULAR | Status: DC | PRN
  Administered 2020-12-20: 30 mL

## 2020-12-20 SURGICAL SUPPLY — 45 items
ADH SKN CLS APL DERMABOND .7 (GAUZE/BANDAGES/DRESSINGS) ×1
APL PRP STRL LF DISP 70% ISPRP (MISCELLANEOUS) ×1
APPLIER CLIP ROT 10 11.4 M/L (STAPLE) ×2
APR CLP MED LRG 11.4X10 (STAPLE) ×1
BAG SPEC RTRVL 10 TROC 200 (ENDOMECHANICALS) ×1
CABLE HIGH FREQUENCY MONO STRZ (ELECTRODE) ×2 IMPLANT
CATH URET 5FR 28IN OPEN ENDED (CATHETERS) ×1 IMPLANT
CHLORAPREP W/TINT 26 (MISCELLANEOUS) ×2 IMPLANT
CLIP APPLIE ROT 10 11.4 M/L (STAPLE) ×1 IMPLANT
COVER MAYO STAND STRL (DRAPES) ×1 IMPLANT
COVER SURGICAL LIGHT HANDLE (MISCELLANEOUS) ×2 IMPLANT
COVER WAND RF STERILE (DRAPES) IMPLANT
DECANTER SPIKE VIAL GLASS SM (MISCELLANEOUS) ×1 IMPLANT
DERMABOND ADVANCED (GAUZE/BANDAGES/DRESSINGS) ×1
DERMABOND ADVANCED .7 DNX12 (GAUZE/BANDAGES/DRESSINGS) ×1 IMPLANT
DRAPE C-ARM 42X120 X-RAY (DRAPES) ×1 IMPLANT
ELECT REM PT RETURN 15FT ADLT (MISCELLANEOUS) ×2 IMPLANT
ENDOLOOP SUT PDS II  0 18 (SUTURE) ×2
ENDOLOOP SUT PDS II 0 18 (SUTURE) ×1 IMPLANT
GLOVE SRG 8 PF TXTR STRL LF DI (GLOVE) ×1 IMPLANT
GLOVE SURG ENC MOIS LTX SZ7.5 (GLOVE) ×2 IMPLANT
GLOVE SURG UNDER POLY LF SZ8 (GLOVE) ×2
GOWN STRL REUS W/TWL LRG LVL3 (GOWN DISPOSABLE) ×2 IMPLANT
GOWN STRL REUS W/TWL XL LVL3 (GOWN DISPOSABLE) ×4 IMPLANT
GRASPER SUT TROCAR 14GX15 (MISCELLANEOUS) ×1 IMPLANT
HEMOSTAT SNOW SURGICEL 2X4 (HEMOSTASIS) IMPLANT
IV CATH 14GX2 1/4 (CATHETERS) ×2 IMPLANT
KIT BASIN OR (CUSTOM PROCEDURE TRAY) ×2 IMPLANT
KIT TURNOVER KIT A (KITS) ×2 IMPLANT
NDL INSUFFLATION 14GA 120MM (NEEDLE) ×1 IMPLANT
NEEDLE INSUFFLATION 14GA 120MM (NEEDLE) ×2 IMPLANT
PENCIL SMOKE EVACUATOR (MISCELLANEOUS) IMPLANT
POUCH RETRIEVAL ECOSAC 10 (ENDOMECHANICALS) ×1 IMPLANT
POUCH RETRIEVAL ECOSAC 10MM (ENDOMECHANICALS) ×2
SCISSORS LAP 5X35 DISP (ENDOMECHANICALS) ×2 IMPLANT
SET CHOLANGIOGRAPH MIX (MISCELLANEOUS) ×1 IMPLANT
SET IRRIG TUBING LAPAROSCOPIC (IRRIGATION / IRRIGATOR) ×2 IMPLANT
SET TUBE SMOKE EVAC HIGH FLOW (TUBING) ×2 IMPLANT
SLEEVE XCEL OPT CAN 5 100 (ENDOMECHANICALS) ×4 IMPLANT
SUT MNCRL AB 4-0 PS2 18 (SUTURE) ×2 IMPLANT
TOWEL OR 17X26 10 PK STRL BLUE (TOWEL DISPOSABLE) ×2 IMPLANT
TOWEL OR NON WOVEN STRL DISP B (DISPOSABLE) IMPLANT
TRAY LAPAROSCOPIC (CUSTOM PROCEDURE TRAY) ×2 IMPLANT
TROCAR BLADELESS OPT 5 100 (ENDOMECHANICALS) ×2 IMPLANT
TROCAR XCEL 12X100 BLDLESS (ENDOMECHANICALS) ×2 IMPLANT

## 2020-12-20 NOTE — Discharge Summary (Deleted)
  Patient ID: Amber Downs [id number] 39 y.o. 12/27/1980  12/19/2020  Discharge date and time: 12/20/2020  Admitting Physician: Hyman Hopes Concepcion Kirkpatrick  Discharge Physician: Hyman Hopes Kelcy Laible  Admission Diagnoses: Pain [R52] Calculus of gallbladder without cholecystitis without obstruction [K80.20] Symptomatic cholelithiasis [K80.20] Patient Active Problem List   Diagnosis Date Noted  . Symptomatic cholelithiasis 12/19/2020  . Normal labor and delivery 08/13/2016  . VBAC, delivered 08/13/2016     Discharge Diagnoses: Cholecystitis Patient Active Problem List   Diagnosis Date Noted  . Symptomatic cholelithiasis 12/19/2020  . Normal labor and delivery 08/13/2016  . VBAC, delivered 08/13/2016    Operations: Procedure(s): LAPAROSCOPIC CHOLECYSTECTOMY WITH INTRAOPERATIVE CHOLANGIOGRAM  Admission Condition: good  Discharged Condition: good  Indication for Admission: Cholecystitis  Hospital Course: [name] presented with abdominal pain, found to have cholecystitis.  She underwent laparoscopic cholecystectomy on 12/19/2020.  She recovered well in the hospital was discharged home the following day.  Consults: None  Significant Diagnostic Studies: RUQ Ultrasound  Treatments: surgery: Laparoscopic cholecystectomy as above  Disposition: Home  Patient Instructions:  Allergies as of 12/20/2020   No Known Allergies     Medication List    STOP taking these medications   doxycycline 100 MG tablet Commonly known as: VIBRA-TABS   finasteride 5 MG tablet Commonly known as: PROSCAR   FLUoxetine 10 MG capsule Commonly known as: PROzac   predniSONE 20 MG tablet Commonly known as: DELTASONE   spironolactone 100 MG tablet Commonly known as: Aldactone     TAKE these medications   acetaminophen 325 MG tablet Commonly known as: TYLENOL Take 650 mg by mouth every 6 (six) hours as needed for moderate pain.   Ibuprofen 200 MG Caps Take 2 capsules by mouth daily as  needed (pain). What changed: Another medication with the same name was removed. Continue taking this medication, and follow the directions you see here.   ipratropium-albuterol 0.5-2.5 (3) MG/3ML Soln Commonly known as: DUONEB Take 3 mLs by nebulization every 6 (six) hours as needed (sob/wheezing).   melatonin 5 MG Tabs Take 5 mg by mouth at bedtime.   oxyCODONE-acetaminophen 5-325 MG tablet Commonly known as: Percocet Take 1 tablet by mouth every 4 (four) hours as needed for severe pain.   Pulmicort Flexhaler 180 MCG/ACT inhaler Generic drug: budesonide Inhale 2 puffs into the lungs in the morning and at bedtime. What changed:   when to take this  reasons to take this       Activity: no heavy lifting for 4 weeks Diet: regular diet Wound Care: keep wound clean and dry  Follow-up:  With [facility] in 3 weeks  Signed: Hyman Hopes Woodward Klem General, Bariatric, & Minimally Invasive Surgery [facility], [place]   12/20/2020, 10:20 AM

## 2020-12-20 NOTE — Progress Notes (Signed)
Progress Note: General Surgery Service   Chief Complaint/Subjective: Amber Downs is feeling little bit better this morning, she thinks not eating anything has helped with the right upper quadrant abdominal pain.  Objective: Vital signs in last 24 hours: Temp:  [97.6 F (36.4 C)-98 F (36.7 C)] 97.6 F (36.4 C) (02/23 0209) Pulse Rate:  [60-90] 60 (02/23 0209) Resp:  [14-21] 14 (02/23 0209) BP: (104-128)/(55-85) 104/55 (02/23 0209) SpO2:  [96 %-100 %] 96 % (02/23 0209) Weight:  [88.5 kg] 88.5 kg (02/22 1300) Last BM Date: 12/19/20  Intake/Output from previous day: No intake/output data recorded. Intake/Output this shift: No intake/output data recorded.  Constitutional: NAD; conversant; no deformities Eyes: Moist conjunctiva; no lid lag; anicteric; PERRL Neck: Trachea midline; no thyromegaly Lungs: Normal respiratory effort; no tactile fremitus CV: RRR; no palpable thrills; no pitting edema GI: Abd right upper quadrant tenderness; no palpable hepatosplenomegaly MSK: Normal range of motion of extremities; no clubbing/cyanosis Psychiatric: Appropriate affect; alert and oriented x3 Lymphatic: No palpable cervical or axillary lymphadenopathy  Lab Results: CBC  Recent Labs    12/19/20 1407  WBC 5.9  HGB 13.1  HCT 40.8  PLT 469*   BMET Recent Labs    12/19/20 1407  NA 139  K 3.4*  CL 106  CO2 22  GLUCOSE 92  BUN 7  CREATININE 0.65  CALCIUM 9.3   PT/INR No results for input(s): LABPROT, INR in the last 72 hours. ABG No results for input(s): PHART, HCO3 in the last 72 hours.  Invalid input(s): PCO2, PO2  Anti-infectives: Anti-infectives (From admission, onward)   Start     Dose/Rate Route Frequency Ordered Stop   12/20/20 0600  ceFAZolin (ANCEF) IVPB 2g/100 mL premix        2 g 200 mL/hr over 30 Minutes Intravenous On call to O.R. 12/19/20 1519 12/21/20 0559      Medications: Scheduled Meds: . enoxaparin (LOVENOX) injection  40 mg Subcutaneous Q24H    Continuous Infusions: . 0.45 % NaCl with KCl 20 mEq / L 75 mL/hr at 12/20/20 0625  .  ceFAZolin (ANCEF) IV     PRN Meds:.acetaminophen **OR** acetaminophen, diphenhydrAMINE **OR** diphenhydrAMINE, methocarbamol, metoprolol tartrate, morphine injection, ondansetron **OR** ondansetron (ZOFRAN) IV, oxyCODONE, polyethylene glycol, simethicone  Assessment/Plan: Amber Downs is a 40 year old female who presents due to right upper quadrant pain, found to have acute cholecystitis.  The recommendation was made to proceed with laparoscopic cholecystectomy.  The risk, benefits, and alternatives were discussed with the patient who granted consent to proceed.  The patient will proceed to the operating room as scheduled.   LOS: 0 days     Quentin Ore, MD  [facility] Use [email or web address] to contact on call provider

## 2020-12-20 NOTE — Op Note (Addendum)
Patient: Amber Downs (03/07/1981, [id number])  Date of Surgery: 12/20/2020   Preoperative Diagnosis: SYMPTOMATIC CHOLELITHIASIS   Postoperative Diagnosis: SYMPTOMATIC CHOLELITHIASIS   Surgical Procedure: LAPAROSCOPIC CHOLECYSTECTOMY WITH INTRAOPERATIVE CHOLANGIOGRAM:    Operative Team Members:  Surgeon(s) and Role:    * Kyrsten Deleeuw, Hyman Hopes, MD - Primary   Anesthesiologist: Gaynelle Adu, MD CRNA: Ezekiel Ina, CRNA; Nelle Don, CRNA   Anesthesia: General   Fluids:  Total I/O In: 100 [IV Piggyback:100] Out: -   Complications: None  Drains:  none   Specimen:  ID Type Source Tests Collected by Time Destination  1 : gallbladder GI Gallbladder SURGICAL PATHOLOGY Amber Downs, Hyman Hopes, MD 12/20/2020 1303      Disposition:  PACU - hemodynamically stable.  Plan of Care: Admit to inpatient     Indications for Procedure: Amber Downs is a 40 y.o. female who presented with abdominal pain.  History, physical and imaging was concerning for cholecystitis.  There was only mild increase in liver enzymes so we decided to proceed with laparoscopic cholecystectomy with intra-operative cholangiogram.  The procedure itself, as well as the risks, benefits and alternatives were discussed with the patient.  Risks discussed included but were not limited to the risk of infection, bleeding, damage to nearby structures, need to convert to open procedure, incisional hernia, bile leak, common bile duct injury and the need for additional procedures or surgeries.  With this discussion complete and all questions answered the patient granted consent to proceed.  Findings: Inflamed gallbladder with gallstones.  Cholangiogram with filling defect at ampulla as well as in the common hepatic duct concerning for choledocholithiasis.  Description of Procedure:   On the date stated above, the patient was taken to the operating room suite and placed in supine positioning.  Sequential  compression devices were placed on the lower extremities to prevent blood clots.  General endotracheal anesthesia was induced. Preoperative antibiotics (cefazolin) were given within 30 minutes of incision.  The patient's abdomen was prepped and draped in the usual sterile fashion.  A time-out was completed verifying the correct patient, procedure, positioning and equipment needed for the case.  We began by anesthetizing the skin with local anesthetic and then making a 5 mm incision just below the umbilicus.  We dissected through the subcutaneous tissues to the fascia.  The fascia was grasped and elevated using a Kocher clamp.  A Veress needle was inserted into the abdomen and the abdomen was insufflated to 15 mmHg.  A 5 mm trocar was inserted in this position under optical guidance and then the abdomen was inspected.  There was no trauma to the underlying viscera with initial trocar placement.  Any abnormal findings, other than inflammation in the right upper quadrant, are listed above in the findings section.  Three additional trocars were placed, one 12 mm trocar in the subxiphoid position, one 5 mm trocar in the midline epigastric area and one 93mm trocar in the right upper quadrant subcostally.  These were placed under direct vision without any trauma to the underlying viscera.    The patient was then placed in head up, left side down positioning.  The gallbladder was identified and dissected free from its attachments to the omentum allowing the duodenum to fall away.  The infundibulum of the gallbladder was dissected free working laterally to medially.  The cystic duct and cystic artery were dissected free from surrounding connective tissue.  The infundibulum of the gallbladder was dissected off the cystic plate.  A  critical view of safety was obtained with the cystic duct and cystic artery being cleared of connective tissues and clearly the only two structures entering into the gallbladder with the liver  clearly visible behind.  One clip was applied high on the cystic duct.  A small ductotomy was created below this using the endoscopic shears.  A cholangiogram catheter was introduced through the abdominal wall and into the cystic duct through this ductotomy.  The catheter was clipped into position.  The catheter was flushed to ensure no leakage around the clip.  We then removed the laparoscopic instruments and positioned the C-Arm to perform a cholangiogram.  The catheter was flushed with contrast under fluoroscopic visualization and a cholangiogram was obtained.  The cholangiogram visualized the biliary tree from the ampulla up to the first two biliary radicals in the liver.  There appeared to be a filling defect distal in the duct at the ampulla.  There also appeared to be a filling defect in the common hepatic duct which could be a bubble of air or a stone.  Due to concern for proximal hepatic duct stone, the decision was made to complete the cholecystectomy and consult gastroenterology for evaluation of ERCP.  The catheter clearly entered the cystic duct.  Please see the EMR for saved representative images.  With our cholangiogram compete, we moved the c-arm away from the field and returned to laparoscopic surgery.    Clips were then applied to the cystic duct and cystic artery and then these structures were divided.  The gallbladder was dissected off the cystic plate, placed in an endocatch bag and removed from the 12 mm subxiphoid port site.  The clips were inspected and appeared effective.  The cystic plate was inspected and hemostasis was obtained using electrocautery.  A suction irrigator was used to clean the operative field.  Attention was turned to closure.  The 12 mm subxiphoid port site was closed using a 0-vicryl suture on a fascial suture passer.  The abdomen was desufflated.  The skin was closed using 4-0 monocryl and dermabond.  All sponge and needle counts were correct at the conclusion of the  case.    Amber Drape, MD General, Bariatric, & Minimally Invasive Surgery [facility], [place]

## 2020-12-20 NOTE — Anesthesia Postprocedure Evaluation (Signed)
Anesthesia Post Note  Patient: Amber Downs  Procedure(s) Performed: LAPAROSCOPIC CHOLECYSTECTOMY WITH INTRAOPERATIVE CHOLANGIOGRAM (N/A )     Patient location during evaluation: PACU Anesthesia Type: General Level of consciousness: awake and alert Pain management: pain level controlled Vital Signs Assessment: post-procedure vital signs reviewed and stable Respiratory status: spontaneous breathing, nonlabored ventilation and respiratory function stable Cardiovascular status: blood pressure returned to baseline and stable Postop Assessment: no apparent nausea or vomiting Anesthetic complications: no   No complications documented.  Last Vitals:  Vitals:   12/20/20 1400 12/20/20 1415  BP: (!) 147/94 (!) 132/96  Pulse: 64   Resp: 13   Temp: 36.6 C   SpO2: 100%     Last Pain:  Vitals:   12/20/20 1415  TempSrc:   PainSc: 6                  Joslynne Klatt,W. EDMOND

## 2020-12-20 NOTE — Discharge Instructions (Signed)
[facility] ° °Please arrive at least 30 min before your appointment to complete your check in paperwork.  If you are unable to arrive 30 min prior to your appointment time we may have to cancel or reschedule you. °LAPAROSCOPIC SURGERY: POST OP INSTRUCTIONS °Always review your discharge instruction sheet given to you by the facility where your surgery was performed. °IF YOU HAVE DISABILITY OR FAMILY LEAVE FORMS, YOU MUST BRING THEM TO THE OFFICE FOR PROCESSING.   °DO NOT GIVE THEM TO YOUR DOCTOR. ° °PAIN CONTROL ° °1. First take acetaminophen (Tylenol) AND/or ibuprofen (Advil) to control your pain after surgery.  Follow directions on package.  Taking acetaminophen (Tylenol) and/or ibuprofen (Advil) regularly after surgery will help to control your pain and lower the amount of prescription pain medication you may need.  You should not take more than 4,000 mg (4 grams) of acetaminophen (Tylenol) in 24 hours.  You should not take ibuprofen (Advil), aleve, motrin, naprosyn or other NSAIDS if you have a history of stomach ulcers or chronic kidney disease.  °2. A prescription for pain medication may be given to you upon discharge.  Take your pain medication as prescribed, if you still have uncontrolled pain after taking acetaminophen (Tylenol) or ibuprofen (Advil). °3. Use ice packs to help control pain. °4. If you need a refill on your pain medication, please contact your pharmacy.  They will contact our office to request authorization. Prescriptions will not be filled after 5pm or on week-ends. ° °HOME MEDICATIONS °5. Take your usually prescribed medications unless otherwise directed. ° °DIET °6. You should follow a light diet the first few days after arrival home.  Be sure to include lots of fluids daily. Avoid fatty, fried foods.  ° °CONSTIPATION °7. It is common to experience some constipation after surgery and if you are taking pain medication.  Increasing fluid intake and taking a stool  softener (such as Colace) will usually help or prevent this problem from occurring.  A mild laxative (Milk of Magnesia or Miralax) should be taken according to package instructions if there are no bowel movements after 48 hours. ° °WOUND/INCISION CARE °8. Most patients will experience some swelling and bruising in the area of the incisions.  Ice packs will help.  Swelling and bruising can take several days to resolve.  °9. Unless discharge instructions indicate otherwise, follow guidelines below  °a. STERI-STRIPS - you may remove your outer bandages 48 hours after surgery, and you may shower at that time.  You have steri-strips (small skin tapes) in place directly over the incision.  These strips should be left on the skin for 7-10 days.   °b. DERMABOND/SKIN GLUE - you may shower in 24 hours.  The glue will flake off over the next 2-3 weeks. °10. Any sutures or staples will be removed at the office during your follow-up visit. ° °ACTIVITIES °11. You may resume regular (light) daily activities beginning the next day--such as daily self-care, walking, climbing stairs--gradually increasing activities as tolerated.  You may have sexual intercourse when it is comfortable.  Refrain from any heavy lifting or straining until approved by your doctor. °a. You may drive when you are no longer taking prescription pain medication, you can comfortably wear a seatbelt, and you can safely maneuver your car and apply brakes. ° °FOLLOW-UP °12. You should see your doctor in the office for a follow-up appointment approximately 2-3 weeks after your surgery.  You should have been given your post-op/follow-up appointment when   your surgery was scheduled.  If you did not receive a post-op/follow-up appointment, make sure that you call for this appointment within a day or two after you arrive home to insure a convenient appointment time. ° °OTHER INSTRUCTIONS ° °WHEN TO CALL YOUR DOCTOR: °1. Fever over 101.0 °2. Inability to  urinate °3. Continued bleeding from incision. °4. Increased pain, redness, or drainage from the incision. °5. Increasing abdominal pain ° °The clinic staff is available to answer your questions during regular business hours.  Please don’t hesitate to call and ask to speak to one of the nurses for clinical concerns.  If you have a medical emergency, go to the nearest emergency room or call 911.  A surgeon from [facility] is always on call at the hospital. °[address], [address], [place], [place]  [address] ? [address] [address], [place], [place]   [address] °[phone] ? [phone] ? FAX [phone] ° ° ° °

## 2020-12-20 NOTE — Anesthesia Preprocedure Evaluation (Addendum)
Anesthesia Evaluation  Patient identified by MRN, date of birth, ID band Patient awake    Reviewed: Allergy & Precautions, H&P , NPO status , Patient's Chart, lab work & pertinent test results  Airway Mallampati: II  TM Distance: >3 FB Neck ROM: Full    Dental no notable dental hx. (+) Teeth Intact, Dental Advisory Given   Pulmonary neg pulmonary ROS,    Pulmonary exam normal breath sounds clear to auscultation       Cardiovascular negative cardio ROS   Rhythm:Regular Rate:Normal     Neuro/Psych  Headaches, negative psych ROS   GI/Hepatic negative GI ROS, Neg liver ROS,   Endo/Other  negative endocrine ROS  Renal/GU negative Renal ROS  negative genitourinary   Musculoskeletal   Abdominal   Peds  Hematology negative hematology ROS (+)   Anesthesia Other Findings   Reproductive/Obstetrics negative OB ROS                            Anesthesia Physical Anesthesia Plan  ASA: II  Anesthesia Plan: General   Post-op Pain Management:    Induction: Intravenous  PONV Risk Score and Plan: 4 or greater and Dexamethasone and Midazolam  Airway Management Planned: Oral ETT  Additional Equipment:   Intra-op Plan:   Post-operative Plan: Extubation in OR  Informed Consent: I have reviewed the patients History and Physical, chart, labs and discussed the procedure including the risks, benefits and alternatives for the proposed anesthesia with the patient or authorized representative who has indicated his/her understanding and acceptance.     Dental advisory given  Plan Discussed with: CRNA  Anesthesia Plan Comments:         Anesthesia Quick Evaluation

## 2020-12-20 NOTE — H&P (View-Only) (Signed)
[facility] Consult  Referring Provider: Quentin Ore, MD/CCS Primary Care Physician:  Orland Mustard, MD Primary Gastroenterologist: Gentry Fitz  Reason for Consultation: Positive Intra-Op cholangiogram  HPI: Amber Downs is a 40 y.o. female was admitted from the ER yesterday with epigastric and right upper quadrant abdominal pain with radiation to the back and her shoulders, mostly postprandial, not associated with nausea, vomiting or fever.  Patient underwent laparoscopic cholecystectomy today and was found to have a filling defect in the distal CBD on intraoperative cholangiogram. We were consulted for ERCP.  Patient denies prior GI symptoms. In particular denies acid reflux, heartburn, difficulty swallowing, pain on swallowing, early satiety, unintentional weight loss, change in bowel habits, blood in stool or black stools.  No prior EGD or colonoscopy. No family history of GI malignancy. Patient states she had Covid infection in January, did not require hospitalization but was treated with doxycycline and nebulizers.  She is a Tax adviser.   Past Medical History:  Diagnosis Date  . Headache   . Medical history non-contributory     Past Surgical History:  Procedure Laterality Date  . APPENDECTOMY    . CESAREAN SECTION N/A 04/06/2013   Procedure: CESAREAN SECTION;  Surgeon: Mitchel Honour, DO;  Location: [facility];  Service: Obstetrics;  Laterality: N/A;  Primary Cesarean Section Delivery Baby Girl @ 1803, Apgars  . WISDOM TOOTH EXTRACTION      Prior to Admission medications   Medication Sig Start Date End Date Taking? Authorizing Provider  acetaminophen (TYLENOL) 325 MG tablet Take 650 mg by mouth every 6 (six) hours as needed for moderate pain.   Yes [provider]  Ibuprofen 200 MG CAPS Take 2 capsules by mouth daily as needed (pain).   Yes [provider]  melatonin 5 MG TABS Take 5 mg by mouth at bedtime.   Yes [provider]  oxyCODONE-acetaminophen (PERCOCET) 5-325 MG tablet Take 1 tablet by mouth every 4 (four) hours as needed for severe pain. 12/20/20 12/20/21 Yes Stechschulte, Hyman Hopes, MD  budesonide (PULMICORT FLEXHALER) 180 MCG/ACT inhaler Inhale 2 puffs into the lungs in the morning and at bedtime. Patient taking differently: Inhale 2 puffs into the lungs 2 (two) times daily as needed (sob). 11/16/20   Orland Mustard, MD  doxycycline (VIBRA-TABS) 100 MG tablet Take 100 mg by mouth 2 (two) times daily. Patient not taking: Reported on 12/19/2020 11/13/20   [provider]  finasteride (PROSCAR) 5 MG tablet Take 1 tablet (5 mg total) by mouth daily. X 10 days Patient not taking: Reported on 12/19/2020 11/16/20   Orland Mustard, MD  FLUoxetine (PROZAC) 10 MG capsule Take 1 capsule (10 mg total) by mouth daily. Patient not taking: Reported on 12/19/2020 11/16/20   Orland Mustard, MD  ibuprofen (ADVIL,MOTRIN) 600 MG tablet Take 1 tablet (600 mg total) by mouth every 6 (six) hours. Patient not taking: Reported on 12/19/2020 08/14/16   Julio Sicks, NP  ipratropium-albuterol (DUONEB) 0.5-2.5 (3) MG/3ML SOLN Take 3 mLs by nebulization every 6 (six) hours as needed (sob/wheezing). 11/17/20   [provider]  predniSONE (DELTASONE) 20 MG tablet Take 60mg  daily x 4 days then 40mg  daily x 4 days then 20mg  daily x 4 days then 10mg  daily x 4 days Patient not taking: Reported on 12/19/2020 11/16/20   , MD  spironolactone (ALDACTONE) 100 MG tablet Take 1 tablet (100 mg total) by mouth 2 (two) times daily. For 10 days only Patient not taking: Reported on 12/19/2020 11/16/20  Orland Mustard, MD    Current Facility-Administered Medications  Medication Dose Route Frequency Provider Last Rate Last Admin  . 0.45 % NaCl with KCl 20 mEq / L infusion   Intravenous Continuous Franne Forts, PA-C 75 mL/hr at 12/20/20 2992 New Bag at 12/20/20 4268  . [MAR Hold] acetaminophen (TYLENOL) tablet 650 mg   650 mg Oral Q6H PRN Meuth, Brooke A, PA-C   650 mg at 12/19/20 2130   Or  . [MAR Hold] acetaminophen (TYLENOL) suppository 650 mg  650 mg Rectal Q6H PRN Meuth, Brooke A, PA-C      . bupivacaine-EPINEPHrine (MARCAINE W/ EPI) 0.25% -1:200000 (with pres) injection    PRN Stechschulte, Hyman Hopes, MD   30 mL at 12/20/20 1343  . [MAR Hold] diphenhydrAMINE (BENADRYL) capsule 25 mg  25 mg Oral Q6H PRN Meuth, Brooke A, PA-C       Or  . [MAR Hold] diphenhydrAMINE (BENADRYL) injection 25 mg  25 mg Intravenous Q6H PRN Meuth, Brooke A, PA-C      . [MAR Hold] enoxaparin (LOVENOX) injection 40 mg  40 mg Subcutaneous Q24H Meuth, Brooke A, PA-C   40 mg at 12/19/20 2137  . HYDROmorphone (DILAUDID) injection 0.25-0.5 mg  0.25-0.5 mg Intravenous Q5 min PRN Gaynelle Adu, MD   0.5 mg at 12/20/20 1412  . iohexol (OMNIPAQUE) 300 MG/ML solution    PRN Stechschulte, Hyman Hopes, MD   20 mL at 12/20/20 1335  . lactated ringers infusion   Intravenous Continuous Gaynelle Adu, MD 20 mL/hr at 12/20/20 1140 Restarted at 12/20/20 1237  . lactated ringers infusion    Continuous PRN Stechschulte, Hyman Hopes, MD   1,000 mL at 12/20/20 1334  . [MAR Hold] methocarbamol (ROBAXIN) tablet 500 mg  500 mg Oral Q6H PRN Meuth, Brooke A, PA-C   500 mg at 12/19/20 2300  . [MAR Hold] metoprolol tartrate (LOPRESSOR) injection 5 mg  5 mg Intravenous Q6H PRN Meuth, Brooke A, PA-C      . [MAR Hold] morphine 2 MG/ML injection 2 mg  2 mg Intravenous Q3H PRN Meuth, Brooke A, PA-C      . [MAR Hold] ondansetron (ZOFRAN-ODT) disintegrating tablet 4 mg  4 mg Oral Q6H PRN Meuth, Brooke A, PA-C       Or  . [MAR Hold] ondansetron (ZOFRAN) injection 4 mg  4 mg Intravenous Q6H PRN Meuth, Brooke A, PA-C      . [MAR Hold] oxyCODONE (Oxy IR/ROXICODONE) immediate release tablet 5-10 mg  5-10 mg Oral Q4H PRN Meuth, Brooke A, PA-C   10 mg at 12/20/20 1022  . [MAR Hold] polyethylene glycol (MIRALAX / GLYCOLAX) packet 17 g  17 g Oral Daily PRN Meuth, Brooke A, PA-C       . scopolamine (TRANSDERM-SCOP) 1 MG/3DAYS 1.5 mg  1 patch Transdermal Patton Salles, MD   1.5 mg at 12/20/20 1139  . scopolamine (TRANSDERM-SCOP) 1 MG/3DAYS           . [MAR Hold] simethicone (MYLICON) chewable tablet 40 mg  40 mg Oral Q6H PRN Meuth, Brooke A, PA-C      . sodium chloride irrigation 0.9 %    PRN Stechschulte, Hyman Hopes, MD   1,000 mL at 12/20/20 1302    Allergies as of 12/19/2020  . (No Known Allergies)    Family History  Problem Relation Age of Onset  . Heart disease Father   . Heart disease Paternal Uncle   . Stroke Maternal Grandmother   . Breast cancer  Maternal Grandmother   . Heart disease Maternal Grandfather   . Diabetes Paternal Grandmother   . Thyroid disease Paternal Grandmother   . Stroke Paternal Grandmother   . Breast cancer Paternal Grandmother   . Heart disease Paternal Grandfather   . Stroke Paternal Grandfather   . Cancer Paternal Grandfather        colon    Social History   Socioeconomic History  . Marital status: Married    Spouse name: Not on file  . Number of children: Not on file  . Years of education: Not on file  . Highest education level: Not on file  Occupational History  . Not on file  Tobacco Use  . Smoking status: Never Smoker  . Smokeless tobacco: Never Used  Substance and Sexual Activity  . Alcohol use: No  . Drug use: No  . Sexual activity: Yes  Other Topics Concern  . Not on file  Social History Narrative  . Not on file   Social Determinants of Health   Financial Resource Strain: Not on file  Food Insecurity: Not on file  Transportation Needs: Not on file  Physical Activity: Not on file  Stress: Not on file  Social Connections: Not on file  Intimate Partner Violence: Not on file    Review of Systems:  GI: Described in detail in HPI.    Gen: Denies any fever, chills, rigors, night sweats, anorexia, fatigue, weakness, malaise, involuntary weight loss, and sleep disorder CV: Denies chest pain,  angina, palpitations, syncope, orthopnea, PND, peripheral edema, and claudication. Resp: Denies dyspnea, cough, sputum, wheezing, coughing up blood. GU : Denies urinary burning, blood in urine, urinary frequency, urinary hesitancy, nocturnal urination, and urinary incontinence. MS: Denies joint pain or swelling.  Denies muscle weakness, cramps, atrophy.  Derm: Denies rash, itching, oral ulcerations, hives, unhealing ulcers.  Psych: Denies depression, anxiety, memory loss, suicidal ideation, hallucinations,  and confusion. Heme: Denies bruising, bleeding, and enlarged lymph nodes. Neuro:  Denies any headaches, dizziness, paresthesias. Endo:  Denies any problems with DM, thyroid, adrenal function.  Physical Exam: Vital signs in last 24 hours: Temp:  [97.6 F (36.4 C)-98 F (36.7 C)] 97.8 F (36.6 C) (02/23 1400) Pulse Rate:  [60-83] 64 (02/23 1400) Resp:  [13-20] 13 (02/23 1400) BP: (104-147)/(55-94) 147/94 (02/23 1400) SpO2:  [96 %-100 %] 100 % (02/23 1400) Weight:  [88.5 kg] 88.5 kg (02/23 1141) Last BM Date: 12/19/20  General:   Alert,  Well-developed, well-nourished, pleasant and cooperative in NAD Head:  Normocephalic and atraumatic. Eyes:  Sclera clear, no icterus.   Conjunctiva pink. Ears:  Normal auditory acuity. Nose:  No deformity, discharge,  or lesions. Mouth:  No deformity or lesions.  Oropharynx pink & moist. Neck:  Supple; no masses or thyromegaly. Lungs:  Clear throughout to auscultation.   No wheezes, crackles, or rhonchi. No acute distress. Heart:  Regular rate and rhythm; no murmurs, clicks, rubs,  or gallops. Extremities:  Without clubbing or edema. Neurologic:  Alert and  oriented x4;  grossly normal neurologically. Skin:  Intact without significant lesions or rashes. Psych:  Alert and cooperative. Normal mood and affect. Abdomen: 4 small laparoscopic surgical incisions noted, mild generalized tenderness, sluggish bowel sounds.     Lab Results: Recent Labs     12/19/20 1407  WBC 5.9  HGB 13.1  HCT 40.8  PLT 469*   BMET Recent Labs    12/19/20 1407  NA 139  K 3.4*  CL 106  CO2 22  GLUCOSE 92  BUN 7  CREATININE 0.65  CALCIUM 9.3   LFT Recent Labs    12/19/20 1407  PROT 7.7  ALBUMIN 4.2  AST 174*  ALT 424*  ALKPHOS 161*  BILITOT 1.5*   PT/INR No results for input(s): LABPROT, INR in the last 72 hours.  Studies/Results: DG Cholangiogram Operative  Result Date: 12/20/2020 CLINICAL DATA:  39-year-old female with a history of cholelithiasis EXAM: INTRAOPERATIVE CHOLANGIOGRAM TECHNIQUE: Cholangiographic images from the C-arm fluoroscopic device were submitted for interpretation post-operatively. Please see the procedural report for the amount of contrast and the fluoroscopy time utilized. COMPARISON:  None. FINDINGS: Surgical instruments project over the upper abdomen. There is cannulation of the cystic duct/gallbladder neck, with antegrade infusion of contrast. Caliber of the extrahepatic ductal system dilated There is incomplete contrast column in the distal common bile duct at the ampulla, with vague filling defect. Contrast traverses this site and enters the duodenum. Free flow of contrast across the ampulla. IMPRESSION: Intraoperative cholangiogram demonstrates extrahepatic biliary ducts dilated for a patient of this age, with a filling defects just above the ampulla, concerning for incompletely obstructing retained debris/choledocholithiasis. Correlation with either ERCP or MRCP recommended Please refer to the dictated operative report for full details of intraoperative findings and procedure Electronically Signed   By: Jaime  Wagner D.O.   On: 12/20/2020 13:37   US Abdomen Limited RUQ (LIVER/GB)  Result Date: 12/19/2020 CLINICAL DATA:  Abdominal pain, no prior surgeries reported in this 39-year-old female. EXAM: ULTRASOUND ABDOMEN LIMITED RIGHT UPPER QUADRANT COMPARISON:  Remote study from 2007 FINDINGS: Gallbladder: Numerous  gallstones in the gallbladder lumen, mobile the largest approximately 1.5 cm. No gallbladder wall thickening or pericholecystic fluid. No reported tenderness over the gallbladder. Common bile duct: Diameter: 5.2 mm Liver: Echogenic liver, limited assessment overall due to patient body habitus and hepatic echogenicity. Portal vein is patent on color Doppler imaging with normal direction of blood flow towards the liver. Other: None. IMPRESSION: Cholelithiasis without associated sonographic signs to suggest acute cholecystitis at this time. Top normal size of the common bile duct. Correlate with hepatic enzymes. Electronically Signed   By: Geoffrey  Wile M.D.   On: 12/19/2020 14:31    Impression: Filling defects noted just above ampulla on intraoperative cholangiogram concerning for retained debris/choledocholithiasis  Status post laparoscopic cholecystectomy for cholelithiasis No leukocytosis T bili 1.5/AST 174/ALT 424/ALP 161, normal lipase  Plan: ERCP tomorrow. The risks(including pancreatitis, infection, bleeding, perforation, anesthesia complications) and the benefits of the procedure were discussed with the patient in details. She understands and verbalizes consent. She is going to be started on full liquid diet post surgery, and will be kept n.p.o. post midnight for ERCP in a.m. tomorrow.   LOS: 0 days   Arya Karki, MD  12/20/2020, 2:34 PM  

## 2020-12-20 NOTE — Transfer of Care (Signed)
Immediate Anesthesia Transfer of Care Note  Patient: Amber Downs First  Procedure(s) Performed: LAPAROSCOPIC CHOLECYSTECTOMY WITH INTRAOPERATIVE CHOLANGIOGRAM (N/A )  Patient Location: PACU  Anesthesia Type:General  Level of Consciousness: awake, alert  and oriented  Airway & Oxygen Therapy: Patient Spontanous Breathing and Patient connected to face mask oxygen  Post-op Assessment: Report given to RN, Post -op Vital signs reviewed and stable and Patient moving all extremities X 4  Post vital signs: Reviewed and stable  Last Vitals:  Vitals Value Taken Time  BP    Temp    Pulse    Resp    SpO2      Last Pain:  Vitals:   12/20/20 1340  TempSrc: Oral  PainSc:       Patients Stated Pain Goal: 3 (12/20/20 1141)  Complications: No complications documented.

## 2020-12-20 NOTE — Anesthesia Procedure Notes (Signed)
Procedure Name: Intubation Date/Time: 12/20/2020 12:46 PM Performed by: Niel Hummer, CRNA Pre-anesthesia Checklist: Patient identified, Emergency Drugs available, Suction available and Patient being monitored Patient Re-evaluated:Patient Re-evaluated prior to induction Oxygen Delivery Method: Circle system utilized Preoxygenation: Pre-oxygenation with 100% oxygen Induction Type: IV induction Ventilation: Mask ventilation without difficulty Laryngoscope Size: Mac and 4 Grade View: Grade I Tube type: Oral Tube size: 7.0 mm Number of attempts: 1 Airway Equipment and Method: Stylet Placement Confirmation: ETT inserted through vocal cords under direct vision,  positive ETCO2 and breath sounds checked- equal and bilateral Secured at: 22 cm Tube secured with: Tape Dental Injury: Teeth and Oropharynx as per pre-operative assessment

## 2020-12-20 NOTE — Consult Note (Signed)
[facility] Consult  Referring Provider: Quentin Ore, MD/CCS Primary Care Physician:  Orland Mustard, MD Primary Gastroenterologist: Gentry Fitz  Reason for Consultation: Positive Intra-Op cholangiogram  HPI: Amber Downs is a 40 y.o. female was admitted from the ER yesterday with epigastric and right upper quadrant abdominal pain with radiation to the back and her shoulders, mostly postprandial, not associated with nausea, vomiting or fever.  Patient underwent laparoscopic cholecystectomy today and was found to have a filling defect in the distal CBD on intraoperative cholangiogram. We were consulted for ERCP.  Patient denies prior GI symptoms. In particular denies acid reflux, heartburn, difficulty swallowing, pain on swallowing, early satiety, unintentional weight loss, change in bowel habits, blood in stool or black stools.  No prior EGD or colonoscopy. No family history of GI malignancy. Patient states she had Covid infection in January, did not require hospitalization but was treated with doxycycline and nebulizers.  She is a Tax adviser.   Past Medical History:  Diagnosis Date  . Headache   . Medical history non-contributory     Past Surgical History:  Procedure Laterality Date  . APPENDECTOMY    . CESAREAN SECTION N/A 04/06/2013   Procedure: CESAREAN SECTION;  Surgeon: Mitchel Honour, DO;  Location: [facility];  Service: Obstetrics;  Laterality: N/A;  Primary Cesarean Section Delivery Baby Girl @ 1803, Apgars  . WISDOM TOOTH EXTRACTION      Prior to Admission medications   Medication Sig Start Date End Date Taking? Authorizing Provider  acetaminophen (TYLENOL) 325 MG tablet Take 650 mg by mouth every 6 (six) hours as needed for moderate pain.   Yes [provider]  Ibuprofen 200 MG CAPS Take 2 capsules by mouth daily as needed (pain).   Yes [provider]  melatonin 5 MG TABS Take 5 mg by mouth at bedtime.   Yes [provider]  oxyCODONE-acetaminophen (PERCOCET) 5-325 MG tablet Take 1 tablet by mouth every 4 (four) hours as needed for severe pain. 12/20/20 12/20/21 Yes Stechschulte, Hyman Hopes, MD  budesonide (PULMICORT FLEXHALER) 180 MCG/ACT inhaler Inhale 2 puffs into the lungs in the morning and at bedtime. Patient taking differently: Inhale 2 puffs into the lungs 2 (two) times daily as needed (sob). 11/16/20   Orland Mustard, MD  doxycycline (VIBRA-TABS) 100 MG tablet Take 100 mg by mouth 2 (two) times daily. Patient not taking: Reported on 12/19/2020 11/13/20   [provider]  finasteride (PROSCAR) 5 MG tablet Take 1 tablet (5 mg total) by mouth daily. X 10 days Patient not taking: Reported on 12/19/2020 11/16/20   Orland Mustard, MD  FLUoxetine (PROZAC) 10 MG capsule Take 1 capsule (10 mg total) by mouth daily. Patient not taking: Reported on 12/19/2020 11/16/20   Orland Mustard, MD  ibuprofen (ADVIL,MOTRIN) 600 MG tablet Take 1 tablet (600 mg total) by mouth every 6 (six) hours. Patient not taking: Reported on 12/19/2020 08/14/16   Julio Sicks, NP  ipratropium-albuterol (DUONEB) 0.5-2.5 (3) MG/3ML SOLN Take 3 mLs by nebulization every 6 (six) hours as needed (sob/wheezing). 11/17/20   [provider]  predniSONE (DELTASONE) 20 MG tablet Take 60mg  daily x 4 days then 40mg  daily x 4 days then 20mg  daily x 4 days then 10mg  daily x 4 days Patient not taking: Reported on 12/19/2020 11/16/20   , MD  spironolactone (ALDACTONE) 100 MG tablet Take 1 tablet (100 mg total) by mouth 2 (two) times daily. For 10 days only Patient not taking: Reported on 12/19/2020 11/16/20  Orland Mustard, MD    Current Facility-Administered Medications  Medication Dose Route Frequency Provider Last Rate Last Admin  . 0.45 % NaCl with KCl 20 mEq / L infusion   Intravenous Continuous Franne Forts, PA-C 75 mL/hr at 12/20/20 2992 New Bag at 12/20/20 4268  . [MAR Hold] acetaminophen (TYLENOL) tablet 650 mg   650 mg Oral Q6H PRN Meuth, Brooke A, PA-C   650 mg at 12/19/20 2130   Or  . [MAR Hold] acetaminophen (TYLENOL) suppository 650 mg  650 mg Rectal Q6H PRN Meuth, Brooke A, PA-C      . bupivacaine-EPINEPHrine (MARCAINE W/ EPI) 0.25% -1:200000 (with pres) injection    PRN Stechschulte, Hyman Hopes, MD   30 mL at 12/20/20 1343  . [MAR Hold] diphenhydrAMINE (BENADRYL) capsule 25 mg  25 mg Oral Q6H PRN Meuth, Brooke A, PA-C       Or  . [MAR Hold] diphenhydrAMINE (BENADRYL) injection 25 mg  25 mg Intravenous Q6H PRN Meuth, Brooke A, PA-C      . [MAR Hold] enoxaparin (LOVENOX) injection 40 mg  40 mg Subcutaneous Q24H Meuth, Brooke A, PA-C   40 mg at 12/19/20 2137  . HYDROmorphone (DILAUDID) injection 0.25-0.5 mg  0.25-0.5 mg Intravenous Q5 min PRN Gaynelle Adu, MD   0.5 mg at 12/20/20 1412  . iohexol (OMNIPAQUE) 300 MG/ML solution    PRN Stechschulte, Hyman Hopes, MD   20 mL at 12/20/20 1335  . lactated ringers infusion   Intravenous Continuous Gaynelle Adu, MD 20 mL/hr at 12/20/20 1140 Restarted at 12/20/20 1237  . lactated ringers infusion    Continuous PRN Stechschulte, Hyman Hopes, MD   1,000 mL at 12/20/20 1334  . [MAR Hold] methocarbamol (ROBAXIN) tablet 500 mg  500 mg Oral Q6H PRN Meuth, Brooke A, PA-C   500 mg at 12/19/20 2300  . [MAR Hold] metoprolol tartrate (LOPRESSOR) injection 5 mg  5 mg Intravenous Q6H PRN Meuth, Brooke A, PA-C      . [MAR Hold] morphine 2 MG/ML injection 2 mg  2 mg Intravenous Q3H PRN Meuth, Brooke A, PA-C      . [MAR Hold] ondansetron (ZOFRAN-ODT) disintegrating tablet 4 mg  4 mg Oral Q6H PRN Meuth, Brooke A, PA-C       Or  . [MAR Hold] ondansetron (ZOFRAN) injection 4 mg  4 mg Intravenous Q6H PRN Meuth, Brooke A, PA-C      . [MAR Hold] oxyCODONE (Oxy IR/ROXICODONE) immediate release tablet 5-10 mg  5-10 mg Oral Q4H PRN Meuth, Brooke A, PA-C   10 mg at 12/20/20 1022  . [MAR Hold] polyethylene glycol (MIRALAX / GLYCOLAX) packet 17 g  17 g Oral Daily PRN Meuth, Brooke A, PA-C       . scopolamine (TRANSDERM-SCOP) 1 MG/3DAYS 1.5 mg  1 patch Transdermal Patton Salles, MD   1.5 mg at 12/20/20 1139  . scopolamine (TRANSDERM-SCOP) 1 MG/3DAYS           . [MAR Hold] simethicone (MYLICON) chewable tablet 40 mg  40 mg Oral Q6H PRN Meuth, Brooke A, PA-C      . sodium chloride irrigation 0.9 %    PRN Stechschulte, Hyman Hopes, MD   1,000 mL at 12/20/20 1302    Allergies as of 12/19/2020  . (No Known Allergies)    Family History  Problem Relation Age of Onset  . Heart disease Father   . Heart disease Paternal Uncle   . Stroke Maternal Grandmother   . Breast cancer  Maternal Grandmother   . Heart disease Maternal Grandfather   . Diabetes Paternal Grandmother   . Thyroid disease Paternal Grandmother   . Stroke Paternal Grandmother   . Breast cancer Paternal Grandmother   . Heart disease Paternal Grandfather   . Stroke Paternal Grandfather   . Cancer Paternal Grandfather        colon    Social History   Socioeconomic History  . Marital status: Married    Spouse name: Not on file  . Number of children: Not on file  . Years of education: Not on file  . Highest education level: Not on file  Occupational History  . Not on file  Tobacco Use  . Smoking status: Never Smoker  . Smokeless tobacco: Never Used  Substance and Sexual Activity  . Alcohol use: No  . Drug use: No  . Sexual activity: Yes  Other Topics Concern  . Not on file  Social History Narrative  . Not on file   Social Determinants of Health   Financial Resource Strain: Not on file  Food Insecurity: Not on file  Transportation Needs: Not on file  Physical Activity: Not on file  Stress: Not on file  Social Connections: Not on file  Intimate Partner Violence: Not on file    Review of Systems:  GI: Described in detail in HPI.    Gen: Denies any fever, chills, rigors, night sweats, anorexia, fatigue, weakness, malaise, involuntary weight loss, and sleep disorder CV: Denies chest pain,  angina, palpitations, syncope, orthopnea, PND, peripheral edema, and claudication. Resp: Denies dyspnea, cough, sputum, wheezing, coughing up blood. GU : Denies urinary burning, blood in urine, urinary frequency, urinary hesitancy, nocturnal urination, and urinary incontinence. MS: Denies joint pain or swelling.  Denies muscle weakness, cramps, atrophy.  Derm: Denies rash, itching, oral ulcerations, hives, unhealing ulcers.  Psych: Denies depression, anxiety, memory loss, suicidal ideation, hallucinations,  and confusion. Heme: Denies bruising, bleeding, and enlarged lymph nodes. Neuro:  Denies any headaches, dizziness, paresthesias. Endo:  Denies any problems with DM, thyroid, adrenal function.  Physical Exam: Vital signs in last 24 hours: Temp:  [97.6 F (36.4 C)-98 F (36.7 C)] 97.8 F (36.6 C) (02/23 1400) Pulse Rate:  [60-83] 64 (02/23 1400) Resp:  [13-20] 13 (02/23 1400) BP: (104-147)/(55-94) 147/94 (02/23 1400) SpO2:  [96 %-100 %] 100 % (02/23 1400) Weight:  [88.5 kg] 88.5 kg (02/23 1141) Last BM Date: 12/19/20  General:   Alert,  Well-developed, well-nourished, pleasant and cooperative in NAD Head:  Normocephalic and atraumatic. Eyes:  Sclera clear, no icterus.   Conjunctiva pink. Ears:  Normal auditory acuity. Nose:  No deformity, discharge,  or lesions. Mouth:  No deformity or lesions.  Oropharynx pink & moist. Neck:  Supple; no masses or thyromegaly. Lungs:  Clear throughout to auscultation.   No wheezes, crackles, or rhonchi. No acute distress. Heart:  Regular rate and rhythm; no murmurs, clicks, rubs,  or gallops. Extremities:  Without clubbing or edema. Neurologic:  Alert and  oriented x4;  grossly normal neurologically. Skin:  Intact without significant lesions or rashes. Psych:  Alert and cooperative. Normal mood and affect. Abdomen: 4 small laparoscopic surgical incisions noted, mild generalized tenderness, sluggish bowel sounds.     Lab Results: Recent Labs     12/19/20 1407  WBC 5.9  HGB 13.1  HCT 40.8  PLT 469*   BMET Recent Labs    12/19/20 1407  NA 139  K 3.4*  CL 106  CO2 22  GLUCOSE 92  BUN 7  CREATININE 0.65  CALCIUM 9.3   LFT Recent Labs    12/19/20 1407  PROT 7.7  ALBUMIN 4.2  AST 174*  ALT 424*  ALKPHOS 161*  BILITOT 1.5*   PT/INR No results for input(s): LABPROT, INR in the last 72 hours.  Studies/Results: DG Cholangiogram Operative  Result Date: 12/20/2020 CLINICAL DATA:  40 year old female with a history of cholelithiasis EXAM: INTRAOPERATIVE CHOLANGIOGRAM TECHNIQUE: Cholangiographic images from the C-arm fluoroscopic device were submitted for interpretation post-operatively. Please see the procedural report for the amount of contrast and the fluoroscopy time utilized. COMPARISON:  None. FINDINGS: Surgical instruments project over the upper abdomen. There is cannulation of the cystic duct/gallbladder neck, with antegrade infusion of contrast. Caliber of the extrahepatic ductal system dilated There is incomplete contrast column in the distal common bile duct at the ampulla, with vague filling defect. Contrast traverses this site and enters the duodenum. Free flow of contrast across the ampulla. IMPRESSION: Intraoperative cholangiogram demonstrates extrahepatic biliary ducts dilated for a patient of this age, with a filling defects just above the ampulla, concerning for incompletely obstructing retained debris/choledocholithiasis. Correlation with either ERCP or MRCP recommended Please refer to the dictated operative report for full details of intraoperative findings and procedure Electronically Signed   By: Gilmer MorJaime  Wagner D.O.   On: 12/20/2020 13:37   US Abdomen Limited RUQ (LIVER/GB)  Result Date: 12/19/2020 CLINICAL DATA:  Abdominal pain, no prior surgeries reported in this 40 year old female. EXAM: ULTRASOUND ABDOMEN LIMITED RIGHT UPPER QUADRANT COMPARISON:  Remote study from 2007 FINDINGS: Gallbladder: Numerous  gallstones in the gallbladder lumen, mobile the largest approximately 1.5 cm. No gallbladder wall thickening or pericholecystic fluid. No reported tenderness over the gallbladder. Common bile duct: Diameter: 5.2 mm Liver: Echogenic liver, limited assessment overall due to patient body habitus and hepatic echogenicity. Portal vein is patent on color Doppler imaging with normal direction of blood flow towards the liver. Other: None. IMPRESSION: Cholelithiasis without associated sonographic signs to suggest acute cholecystitis at this time. Top normal size of the common bile duct. Correlate with hepatic enzymes. Electronically Signed   By: Donzetta KohutGeoffrey  Wile M.D.   On: 12/19/2020 14:31    Impression: Filling defects noted just above ampulla on intraoperative cholangiogram concerning for retained debris/choledocholithiasis  Status post laparoscopic cholecystectomy for cholelithiasis No leukocytosis T bili 1.5/AST 174/ALT 424/ALP 161, normal lipase  Plan: ERCP tomorrow. The risks(including pancreatitis, infection, bleeding, perforation, anesthesia complications) and the benefits of the procedure were discussed with the patient in details. She understands and verbalizes consent. She is going to be started on full liquid diet post surgery, and will be kept n.p.o. post midnight for ERCP in a.m. tomorrow.   LOS: 0 days   Kerin SalenArya Karki, MD  12/20/2020, 2:34 PM

## 2020-12-21 ENCOUNTER — Inpatient Hospital Stay (HOSPITAL_COMMUNITY): Payer: BC Managed Care – PPO | Admitting: Anesthesiology

## 2020-12-21 ENCOUNTER — Encounter (HOSPITAL_COMMUNITY)
Admission: EM | Disposition: A | Discharge status: Home / Self Care | Payer: Self-pay | Source: Home / Self Care | Attending: Emergency Medicine

## 2020-12-21 ENCOUNTER — Encounter (HOSPITAL_COMMUNITY): Payer: Self-pay | Admitting: Surgery

## 2020-12-21 ENCOUNTER — Inpatient Hospital Stay (HOSPITAL_COMMUNITY): Payer: BC Managed Care – PPO

## 2020-12-21 DIAGNOSIS — R52 Pain, unspecified: Secondary | ICD-10-CM | POA: Diagnosis present

## 2020-12-21 DIAGNOSIS — Z20822 Contact with and (suspected) exposure to covid-19: Secondary | ICD-10-CM | POA: Diagnosis present

## 2020-12-21 DIAGNOSIS — K8062 Calculus of gallbladder and bile duct with acute cholecystitis without obstruction: Secondary | ICD-10-CM | POA: Diagnosis present

## 2020-12-21 DIAGNOSIS — Z79899 Other long term (current) drug therapy: Secondary | ICD-10-CM | POA: Diagnosis not present

## 2020-12-21 DIAGNOSIS — K810 Acute cholecystitis: Secondary | ICD-10-CM | POA: Diagnosis present

## 2020-12-21 DIAGNOSIS — Z8616 Personal history of COVID-19: Secondary | ICD-10-CM | POA: Diagnosis not present

## 2020-12-21 DIAGNOSIS — Z9049 Acquired absence of other specified parts of digestive tract: Secondary | ICD-10-CM | POA: Diagnosis not present

## 2020-12-21 HISTORY — PX: REMOVAL OF STONES: SHX5545

## 2020-12-21 HISTORY — PX: SPHINCTEROTOMY: SHX5279

## 2020-12-21 HISTORY — PX: ERCP: SHX5425

## 2020-12-21 LAB — SURGICAL PATHOLOGY

## 2020-12-21 SURGERY — ERCP, WITH INTERVENTION IF INDICATED
Anesthesia: General

## 2020-12-21 MED ORDER — FENTANYL CITRATE (PF) 250 MCG/5ML IJ SOLN
INTRAMUSCULAR | Status: DC | PRN
  Administered 2020-12-21 (×2): 50 ug via INTRAVENOUS

## 2020-12-21 MED ORDER — DEXAMETHASONE SODIUM PHOSPHATE 10 MG/ML IJ SOLN
INTRAMUSCULAR | Status: DC | PRN
  Administered 2020-12-21: 4 mg via INTRAVENOUS

## 2020-12-21 MED ORDER — ACETAMINOPHEN 325 MG PO TABS: 650.0000 mg | ORAL_TABLET | Freq: Four times a day (QID) | ORAL | Status: DC | PRN

## 2020-12-21 MED ORDER — PHENYLEPHRINE HCL-NACL 10-0.9 MG/250ML-% IV SOLN
INTRAVENOUS | Status: DC | PRN
  Administered 2020-12-21: 30 ug/min via INTRAVENOUS

## 2020-12-21 MED ORDER — PHENYLEPHRINE 40 MCG/ML (10ML) SYRINGE FOR IV PUSH (FOR BLOOD PRESSURE SUPPORT)
PREFILLED_SYRINGE | INTRAVENOUS | Status: DC | PRN
  Administered 2020-12-21: 40 ug via INTRAVENOUS

## 2020-12-21 MED ORDER — PROPOFOL 10 MG/ML IV BOLUS
INTRAVENOUS | Status: AC
  Filled 2020-12-21: qty 20

## 2020-12-21 MED ORDER — INDOMETHACIN 50 MG RE SUPP
RECTAL | Status: DC | PRN
  Administered 2020-12-21: 100 mg via RECTAL

## 2020-12-21 MED ORDER — CIPROFLOXACIN IN D5W 400 MG/200ML IV SOLN
INTRAVENOUS | Status: DC | PRN
  Administered 2020-12-21: 400 mg via INTRAVENOUS

## 2020-12-21 MED ORDER — IPRATROPIUM-ALBUTEROL 0.5-2.5 (3) MG/3ML IN SOLN: 3.0000 mL | Freq: Four times a day (QID) | RESPIRATORY_TRACT | Status: DC | PRN

## 2020-12-21 MED ORDER — EPHEDRINE SULFATE-NACL 50-0.9 MG/10ML-% IV SOSY
PREFILLED_SYRINGE | INTRAVENOUS | Status: DC | PRN
  Administered 2020-12-21: 5 mg via INTRAVENOUS

## 2020-12-21 MED ORDER — ROCURONIUM BROMIDE 10 MG/ML (PF) SYRINGE
PREFILLED_SYRINGE | INTRAVENOUS | Status: DC | PRN
  Administered 2020-12-21: 70 mg via INTRAVENOUS

## 2020-12-21 MED ORDER — LACTATED RINGERS IV SOLN: INTRAVENOUS | Status: DC | PRN

## 2020-12-21 MED ORDER — PROPOFOL 10 MG/ML IV BOLUS
INTRAVENOUS | Status: DC | PRN
  Administered 2020-12-21: 200 mg via INTRAVENOUS

## 2020-12-21 MED ORDER — GLUCAGON HCL RDNA (DIAGNOSTIC) 1 MG IJ SOLR
INTRAMUSCULAR | Status: AC
  Filled 2020-12-21: qty 1

## 2020-12-21 MED ORDER — IBUPROFEN 200 MG PO TABS: 400.0000 mg | ORAL_TABLET | Freq: Every day | ORAL | Status: DC | PRN

## 2020-12-21 MED ORDER — BUDESONIDE 0.25 MG/2ML IN SUSP: 0.2500 mg | Freq: Two times a day (BID) | RESPIRATORY_TRACT | Status: DC

## 2020-12-21 MED ORDER — ONDANSETRON HCL 4 MG/2ML IJ SOLN
INTRAMUSCULAR | Status: DC | PRN
  Administered 2020-12-21: 4 mg via INTRAVENOUS

## 2020-12-21 MED ORDER — CIPROFLOXACIN IN D5W 400 MG/200ML IV SOLN
INTRAVENOUS | Status: AC
  Filled 2020-12-21: qty 200

## 2020-12-21 MED ORDER — FENTANYL CITRATE (PF) 100 MCG/2ML IJ SOLN
INTRAMUSCULAR | Status: AC
  Filled 2020-12-21: qty 2

## 2020-12-21 MED ORDER — SODIUM CHLORIDE 0.9 % IV SOLN
INTRAVENOUS | Status: DC | PRN
  Administered 2020-12-21: 40 mL

## 2020-12-21 MED ORDER — INDOMETHACIN 50 MG RE SUPP
RECTAL | Status: AC
  Filled 2020-12-21: qty 2

## 2020-12-21 MED ORDER — MELATONIN 5 MG PO TABS: 5.0000 mg | ORAL_TABLET | Freq: Every day | ORAL | Status: DC

## 2020-12-21 MED ORDER — LIDOCAINE 2% (20 MG/ML) 5 ML SYRINGE
INTRAMUSCULAR | Status: DC | PRN
  Administered 2020-12-21: 60 mg via INTRAVENOUS

## 2020-12-21 MED ORDER — SUGAMMADEX SODIUM 200 MG/2ML IV SOLN
INTRAVENOUS | Status: DC | PRN
  Administered 2020-12-21: 200 mg via INTRAVENOUS

## 2020-12-21 NOTE — Anesthesia Preprocedure Evaluation (Addendum)
Anesthesia Evaluation  Patient identified by MRN, date of birth, ID band Patient awake    Reviewed: Allergy & Precautions, NPO status , Patient's Chart, lab work & pertinent test results  Airway Mallampati: I  TM Distance: >3 FB Neck ROM: Full    Dental no notable dental hx. (+) Teeth Intact, Dental Advisory Given   Pulmonary  covid in jan 2022   Pulmonary exam normal breath sounds clear to auscultation       Cardiovascular negative cardio ROS Normal cardiovascular exam Rhythm:Regular Rate:Normal     Neuro/Psych  Headaches, negative psych ROS   GI/Hepatic negative GI ROS, Bile duct stones  S/p lap CCY yesterday    Endo/Other  Obesity BMI 35  Renal/GU negative Renal ROS  negative genitourinary   Musculoskeletal negative musculoskeletal ROS (+)   Abdominal (+) + obese,   Peds  Hematology negative hematology ROS (+)   Anesthesia Other Findings   Reproductive/Obstetrics negative OB ROS                            Anesthesia Physical Anesthesia Plan  ASA: II  Anesthesia Plan: General   Post-op Pain Management:    Induction: Intravenous and Rapid sequence  PONV Risk Score and Plan: 3 and Ondansetron, Dexamethasone, Midazolam and Treatment may vary due to age or medical condition  Airway Management Planned: Oral ETT  Additional Equipment: None  Intra-op Plan:   Post-operative Plan: Extubation in OR  Informed Consent: I have reviewed the patients History and Physical, chart, labs and discussed the procedure including the risks, benefits and alternatives for the proposed anesthesia with the patient or authorized representative who has indicated his/her understanding and acceptance.     Dental advisory given  Plan Discussed with: CRNA  Anesthesia Plan Comments:        Anesthesia Quick Evaluation

## 2020-12-21 NOTE — Transfer of Care (Signed)
Immediate Anesthesia Transfer of Care Note  Patient: Amber Downs  Procedure(s) Performed: ENDOSCOPIC RETROGRADE CHOLANGIOPANCREATOGRAPHY (ERCP) (N/A ) SPHINCTEROTOMY REMOVAL OF STONES  Patient Location: PACU  Anesthesia Type:General  Level of Consciousness: drowsy  Airway & Oxygen Therapy: Patient Spontanous Breathing  Post-op Assessment: Report given to RN and Post -op Vital signs reviewed and stable  Post vital signs: Reviewed and stable  Last Vitals:  Vitals Value Taken Time  BP 114/63 12/21/20 1311  Temp    Pulse 90 12/21/20 1314  Resp 20 12/21/20 1314  SpO2 96 % 12/21/20 1314  Vitals shown include unvalidated device data.  Last Pain:  Vitals:   12/21/20 1055  TempSrc: Oral  PainSc: 3       Patients Stated Pain Goal: 0 (12/21/20 1055)  Complications: No complications documented.

## 2020-12-21 NOTE — Progress Notes (Signed)
Progress Note  1 Day Post-Op  Subjective: Patient reports mild abdominal pain this AM, feels sore mostly. She reports she had some nausea initially post-op but this resolved and she was tolerating CLD well yesterday evening. Plans for ERCP today. 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   Objective: Vital signs in last 24 hours: Temp:  [97.8 F (36.6 C)-98.1 F (36.7 C)] 97.9 F (36.6 C) (02/24 0529) Pulse Rate:  [53-83] 53 (02/24 0529) Resp:  [13-17] 16 (02/24 0529) BP: (95-147)/(55-96) 103/55 (02/24 0529) SpO2:  [93 %-100 %] 97 % (02/24 0529) Weight:  [88.5 kg] 88.5 kg (02/23 1141) Last BM Date: 12/19/20  Intake/Output from previous day: 02/23 0701 - 02/24 0700 In: 1380 [P.O.:480; I.V.:800; IV Piggyback:100] Out: 5 [Blood:5] Intake/Output this shift: No intake/output data recorded.  PE: General: pleasant, NAD HEENT: sclera anicteric Heart: regular, rate, and rhythm.   Lungs: Respiratory effort nonlabored Abd: soft, mildly ttp in RUQ without guarding, ND, BS hypoactive, incisions c/d/i    Lab Results:  Recent Labs    12/19/20 1407  WBC 5.9  HGB 13.1  HCT 40.8  PLT 469*   BMET Recent Labs    12/19/20 1407  NA 139  K 3.4*  CL 106  CO2 22  GLUCOSE 92  BUN 7  CREATININE 0.65  CALCIUM 9.3   PT/INR No results for input(s): LABPROT, INR in the last 72 hours. CMP     Component Value Date/Time   NA 139 12/19/2020 1407   K 3.4 (L) 12/19/2020 1407   CL 106 12/19/2020 1407   CO2 22 12/19/2020 1407   GLUCOSE 92 12/19/2020 1407   BUN 7 12/19/2020 1407   CREATININE 0.65 12/19/2020 1407   CALCIUM 9.3 12/19/2020 1407   PROT 7.7 12/19/2020 1407   ALBUMIN 4.2 12/19/2020 1407   AST 174 (H) 12/19/2020 1407   ALT 424 (H) 12/19/2020 1407   ALKPHOS 161 (H) 12/19/2020 1407   BILITOT 1.5 (H) 12/19/2020 1407   GFRNONAA >60 12/19/2020 1407   Lipase     Component Value Date/Time    LIPASE 25 12/19/2020 1407       Studies/Results: DG Cholangiogram Operative  Result Date: 12/20/2020 CLINICAL DATA:  40 year old female with a history of cholelithiasis EXAM: INTRAOPERATIVE CHOLANGIOGRAM TECHNIQUE: Cholangiographic images from the C-arm fluoroscopic device were submitted for interpretation post-operatively. Please see the procedural report for the amount of contrast and the fluoroscopy time utilized. COMPARISON:  None. FINDINGS: Surgical instruments project over the upper abdomen. There is cannulation of the cystic duct/gallbladder neck, with antegrade infusion of contrast. Caliber of the extrahepatic ductal system dilated There is incomplete contrast column in the distal common bile duct at the ampulla, with vague filling defect. Contrast traverses this site and enters the duodenum. Free flow of contrast across the ampulla. IMPRESSION: Intraoperative cholangiogram demonstrates extrahepatic biliary ducts dilated for a patient of this age, with a filling defects just above the ampulla, concerning for incompletely obstructing retained debris/choledocholithiasis. Correlation with either ERCP or MRCP recommended Please refer to the dictated operative report for full details of intraoperative findings and procedure Electronically Signed   By: Gilmer Mor D.O.   On: 12/20/2020 13:37   US Abdomen Limited RUQ (LIVER/GB)  Result Date: 12/19/2020 CLINICAL DATA:  Abdominal pain, no prior surgeries reported in this 40 year old female. EXAM: ULTRASOUND ABDOMEN LIMITED RIGHT UPPER QUADRANT COMPARISON:  Remote study from 2007  FINDINGS: Gallbladder: Numerous gallstones in the gallbladder lumen, mobile the largest approximately 1.5 cm. No gallbladder wall thickening or pericholecystic fluid. No reported tenderness over the gallbladder. Common bile duct: Diameter: 5.2 mm Liver: Echogenic liver, limited assessment overall due to patient body habitus and hepatic echogenicity. Portal vein is patent on  color Doppler imaging with normal direction of blood flow towards the liver. Other: None. IMPRESSION: Cholelithiasis without associated sonographic signs to suggest acute cholecystitis at this time. Top normal size of the common bile duct. Correlate with hepatic enzymes. Electronically Signed   By: Donzetta Kohut M.D.   On: 12/19/2020 14:31    Anti-infectives: Anti-infectives (From admission, onward)   Start     Dose/Rate Route Frequency Ordered Stop   12/20/20 0600  ceFAZolin (ANCEF) IVPB 2g/100 mL premix        2 g 200 mL/hr over 30 Minutes Intravenous On call to O.R. 12/19/20 1519 12/20/20 1316       Assessment/Plan Acute cholecystitis Choledocholithiasis S/p laparoscopic cholecystectomy with [facility] 2/23 Dr. Dossie Der - POD#1 - IOC positive, GI planning ERCP today - patient with mild RUQ pain, was tolerating CLD well up until MN - pending ERCP results and how patient is feeling this afternoon, may be able to discharge later today after ERCP or tomorrow AM  FEN: NPO for procedure VTE: lovenox ID: Ancef pre-op 2/23   LOS: 0 days    Juliet Rude , [facility] 12/21/2020, 9:00 AM Please see Amion for pager number during day hours 7:00am-4:30pm

## 2020-12-21 NOTE — Brief Op Note (Signed)
12/19/2020 - 12/21/2020  1:01 PM  PATIENT:  Amber Downs  40 y.o. female  PRE-OPERATIVE DIAGNOSIS:  bile duct stones  POST-OPERATIVE DIAGNOSIS:  * No post-op diagnosis entered *  PROCEDURE:  Procedure(s): ENDOSCOPIC RETROGRADE CHOLANGIOPANCREATOGRAPHY (ERCP) (N/A) SPHINCTEROTOMY REMOVAL OF STONES  SURGEON:  Surgeon(s) and Role:    Ronnette Juniper, MD - Primary  PHYSICIAN ASSISTANT:   ASSISTANTS: Kary Kos, RN  ANESTHESIA:   MAC  EBL:  None  BLOOD ADMINISTERED:none  DRAINS: none   LOCAL MEDICATIONS USED:  NONE  SPECIMEN:  No Specimen  DISPOSITION OF SPECIMEN:  N/A  COUNTS:  YES  TOURNIQUET:  * No tourniquets in log *  DICTATION: .Dragon Dictation  PLAN OF CARE: Admit to inpatient   PATIENT DISPOSITION:  PACU - hemodynamically stable.   Delay start of Pharmacological VTE agent (>24hrs) due to surgical blood loss or risk of bleeding: no

## 2020-12-21 NOTE — Progress Notes (Signed)
Patient discharged to home with family, discharge instructions reviewed with patient who verbalized understanding. New RX electronically sent to pharmacy. 

## 2020-12-21 NOTE — Progress Notes (Signed)
ERCP went well. Sphincterotomy, balloon sweep and CBD stone extraction performed successfully.  Patient without abdominal pain. Okay to advance diet as tolerated. Okay to DC home today if she remains without pain and is able to tolerate diet.

## 2020-12-21 NOTE — Op Note (Addendum)
[facility] Patient Name: Amber Downs Procedure Date: 12/21/2020 MRN: [id number] Attending MD: Kerin Salen , MD Date of Birth: Oct 17, 1981 CSN: [id number] Age: 40 Admit Type: Inpatient Procedure:                ERCP Indications:              Abnormal intraoperative cholangiogram, Filling                            defect on intraoperative cholangiogram Providers:                Kerin Salen, MD, Estella Husk, RN Referring MD:             CCS Medicines:                Monitored Anesthesia Care Complications:            No immediate complications. Estimated blood loss:                            None Estimated Blood Loss:     Estimated blood loss: none. Procedure:                Pre-Anesthesia Assessment:                           - Prior to the procedure, a History and Physical                            was performed, and patient medications and                            allergies were reviewed. The patient's tolerance of                            previous anesthesia was also reviewed. The risks                            and benefits of the procedure and the sedation                            options and risks were discussed with the patient.                            All questions were answered, and informed consent                            was obtained. Prior Anticoagulants: The patient has                            taken no previous anticoagulant or antiplatelet                            agents. ASA Grade Assessment: II - A patient with                            mild systemic disease.  After reviewing the risks                            and benefits, the patient was deemed in                            satisfactory condition to undergo the procedure.                           After obtaining informed consent, the scope was                            passed under direct vision. Throughout the                            procedure, the patient's blood pressure,  pulse, and                            oxygen saturations were monitored continuously. The                            TJF-Q180V ([id number]) Olympus Duodenoscope was                            introduced through the mouth, and used to inject                            contrast into and used to inject contrast into the                            bile duct. The ERCP was accomplished without                            difficulty. The patient tolerated the procedure                            well. Scope In: Scope Out: Findings:      A scout film of the abdomen was obtained. Surgical clips, consistent       with a previous cholecystectomy, were seen in the area of the right       upper quadrant of the abdomen.      The esophagus was successfully intubated under direct vision. The scope       was advanced to a normal major papilla in the descending duodenum       without detailed examination of the pharynx, larynx and associated       structures, and upper GI tract. The upper GI tract was grossly normal.       The bile duct was deeply cannulated with the sphincterotome. Contrast       was injected. I personally interpreted the bile duct images. There was       brisk flow of contrast through the ducts. Image quality was excellent.      A cholecystectomy had been performed.      The lower third of the main bile duct contained a very small filling  defect thought to be a stone. A straight Roadrunner wire was passed into       the biliary tree.      A 10 mm biliary sphincterotomy was made with a braided sphincterotome       using ERBE electrocautery. There was no post-sphincterotomy bleeding.      The biliary tree was swept with a 12 mm balloon starting at the       bifurcation. One stone was removed. The CBD was swept multiple times       with a 12 mm balloon. No stones remained.      The pancreatic duct was not injected during the procedure and patient       was given rectal indomethacin and  IV cipro prior to start of the       procedure. Impression:               - A filling defect consistent with a stone was seen                            on the cholangiogram.                           - The patient has had a cholecystectomy.                           - Choledocholithiasis was found. Complete removal                            was accomplished by biliary sphincterotomy and                            balloon extraction.                           - A biliary sphincterotomy was performed.                           - The biliary tree was swept. Moderate Sedation:      Patient did not receive moderate sedation for this procedure, but       instead received monitored anesthesia care. Recommendation:           - Advance diet as tolerated. Procedure Code(s):        --- Professional ---                           [id number], Endoscopic retrograde                            cholangiopancreatography (ERCP); with removal of                            calculi/debris from biliary/pancreatic duct(s)                           43262, Endoscopic retrograde                            cholangiopancreatography (ERCP); with  sphincterotomy/papillotomy Diagnosis Code(s):        --- Professional ---                           R93.2, Abnormal findings on diagnostic imaging of                            liver and biliary tract                           Z90.49, Acquired absence of other specified parts                            of digestive tract                           K80.50, Calculus of bile duct without cholangitis                            or cholecystitis without obstruction CPT copyright 2019 American Medical Association. All rights reserved. The codes documented in this report are preliminary and upon coder review may  be revised to meet current compliance requirements. Kerin Salen, MD 12/21/2020 1:00:53 PM This report has been signed electronically. Number of  Addenda: 0

## 2020-12-21 NOTE — Anesthesia Postprocedure Evaluation (Signed)
Anesthesia Post Note  Patient: Rubena Roseman  Procedure(s) Performed: ENDOSCOPIC RETROGRADE CHOLANGIOPANCREATOGRAPHY (ERCP) (N/A ) SPHINCTEROTOMY REMOVAL OF STONES     Patient location during evaluation: PACU Anesthesia Type: General Level of consciousness: awake and alert, oriented and patient cooperative Pain management: pain level controlled Vital Signs Assessment: post-procedure vital signs reviewed and stable Respiratory status: spontaneous breathing, nonlabored ventilation and respiratory function stable Cardiovascular status: blood pressure returned to baseline and stable Postop Assessment: no apparent nausea or vomiting Anesthetic complications: no   No complications documented.  Last Vitals:  Vitals:   12/21/20 1313 12/21/20 1320  BP: 114/63 (!) 100/59  Pulse:  (!) 107  Resp: 12 20  Temp: 36.8 C   SpO2: 97% 96%    Last Pain:  Vitals:   12/21/20 1320  TempSrc:   PainSc: 0-No pain                 Lannie Fields

## 2020-12-21 NOTE — Interval H&P Note (Signed)
History and Physical Interval Note: 39/female with a positive intraoperative cholangiogram, s/p lap cholecystectomy for an ERCP today.  12/21/2020 11:06 AM  Amber Downs  has presented today for ERCP, with the diagnosis of abnormal cholangiogram.  The various methods of treatment have been discussed with the patient and family. After consideration of risks, benefits and other options for treatment, the patient has consented to  Procedure(s): ENDOSCOPIC RETROGRADE CHOLANGIOPANCREATOGRAPHY (ERCP) (N/A) as a surgical intervention.  The patient's history has been reviewed, patient examined, no change in status, stable for surgery.  I have reviewed the patient's chart and labs.  Questions were answered to the patient's satisfaction.     Kerin Salen

## 2020-12-21 NOTE — Anesthesia Procedure Notes (Signed)
Procedure Name: Intubation Performed by: Armentha Branagan H, CRNA Pre-anesthesia Checklist: Patient identified, Emergency Drugs available, Suction available and Patient being monitored Patient Re-evaluated:Patient Re-evaluated prior to induction Oxygen Delivery Method: Circle System Utilized Preoxygenation: Pre-oxygenation with 100% oxygen Induction Type: IV induction Ventilation: Mask ventilation without difficulty Laryngoscope Size: Miller and 2 Grade View: Grade I Tube type: Oral Tube size: 7.0 mm Number of attempts: 1 Airway Equipment and Method: Stylet and Oral airway Placement Confirmation: ETT inserted through vocal cords under direct vision,  positive ETCO2 and breath sounds checked- equal and bilateral Secured at: 21 cm Tube secured with: Tape Dental Injury: Teeth and Oropharynx as per pre-operative assessment        

## 2020-12-25 ENCOUNTER — Encounter (HOSPITAL_COMMUNITY): Payer: Self-pay | Admitting: Gastroenterology

## 2020-12-25 NOTE — Discharge Summary (Signed)
Physician Discharge Summary  Patient ID: Amber Downs MRN: [id number] DOB/AGE: May 09, 1981 39 y.o.  Admit date: 12/19/2020 Discharge date: 12/21/2020  Admission Diagnoses:  Acute cholecystitis with choledocholithiasis   Discharge Diagnoses:  Same   Active Problems:   Symptomatic cholelithiasis   Acute cholecystitis   PROCEDURES:  LAPAROSCOPIC CHOLECYSTECTOMY WITH INTRAOPERATIVE CHOLANGIOGRAM, 12/20/20, Dr. Renae Fickle Stechschulte  ERCP with removal of stone, sphincterotomy/papillotomy, 12/21/20 Dr. Christianne Dolin Course:  Amber Downs is an 40 y.o. female who is here for abdominal pain.  Her pain started on Friday and was initially epigastric and then started to radiate to her right upper quadrant and into her back.  Her child was sick with a stomach bug so she thought it may be due to the stomach bug.  Yesterday she got some outpatient LFTs which were elevated, so today she presented to the emergency department to be evaluated.  She was seen in the ED and admitted by Dr. Dossie Der and taken to the OR the following day.  IOC revealed choledocholithiasis.  She was seen by GI and taken to the Endoscopy suite on 12/21/20, she underwent ERCP, removal of the stone and sphincterotomy.  She did well and was discharged after the second procedure.  We arranged for her to have labs drawn before she returns to the office to be sure her LFT's return to normal.    CBC Latest Ref Rng & Units 12/19/2020 08/13/2016 08/13/2016  WBC 4.0 - 10.5 K/uL 5.9 15.2(H) 11.1(H)  Hemoglobin 12.0 - 15.0 g/dL 61.5 3.7(H) 43.2  Hematocrit 36.0 - 46.0 % 40.8 29.3(L) 35.9(L)  Platelets 150 - 400 K/uL 469(H) 302 335    CMP Latest Ref Rng & Units 12/19/2020  Glucose 70 - 99 mg/dL 92  BUN 6 - 20 mg/dL 7  Creatinine 7.61 - 4.70 mg/dL 9.29  Sodium 574 - 734 mmol/L 139  Potassium 3.5 - 5.1 mmol/L 3.4(L)  Chloride 98 - 111 mmol/L 106  CO2 22 - 32 mmol/L 22  Calcium 8.9 - 10.3 mg/dL 9.3  Total Protein 6.5 -  8.1 g/dL 7.7  Total Bilirubin 0.3 - 1.2 mg/dL 0.3(J)  Alkaline Phos 38 - 126 U/L 161(H)  AST 15 - 41 U/L 174(H)  ALT 0 - 44 U/L 424(H)   Condition on DC:  Improved    Disposition: Discharge disposition: 01-Home or Self Care       Discharge Instructions    Diet - low sodium heart healthy   Complete by: As directed    Discharge instructions   Complete by: As directed    No heavy lifting or strenuous activity for 4 weeks.  Do not submerge incisions under water.  Do not drive on pain medication.  Do not text and drive.   Increase activity slowly   Complete by: As directed      Allergies as of 12/21/2020   No Known Allergies     Medication List    STOP taking these medications   doxycycline 100 MG tablet Commonly known as: VIBRA-TABS   finasteride 5 MG tablet Commonly known as: PROSCAR   FLUoxetine 10 MG capsule Commonly known as: PROzac   predniSONE 20 MG tablet Commonly known as: DELTASONE   spironolactone 100 MG tablet Commonly known as: Aldactone     TAKE these medications   acetaminophen 325 MG tablet Commonly known as: TYLENOL Take 650 mg by mouth every 6 (six) hours as needed for moderate pain.   Ibuprofen 200 MG Caps Take 2 capsules  by mouth daily as needed (pain). What changed: Another medication with the same name was removed. Continue taking this medication, and follow the directions you see here.   ipratropium-albuterol 0.5-2.5 (3) MG/3ML Soln Commonly known as: DUONEB Take 3 mLs by nebulization every 6 (six) hours as needed (sob/wheezing).   melatonin 5 MG Tabs Take 5 mg by mouth at bedtime.   oxyCODONE-acetaminophen 5-325 MG tablet Commonly known as: Percocet Take 1 tablet by mouth every 4 (four) hours as needed for severe pain.   Pulmicort Flexhaler 180 MCG/ACT inhaler Generic drug: budesonide Inhale 2 puffs into the lungs in the morning and at bedtime. What changed:   when to take this  reasons to take this       Follow-up  Information    [facility] Go on 01/09/2021.   Specialty: General Surgery Why: Your appointment is 03/15 at 10:30 am Please arrive 30 minutes prior to your appointment to check in and fill out paperwork. Bring photo ID and insurance information. Contact information: [address] [place] [address] [phone]       [facility] Follow up.   Why: Go to lab on 3/14, or 3/11 to have labs drawn.  You can also go to a labs closer to home and have them send results to our office. Contact information: [address] [place], [place]        Orland Mustard, MD Follow up.   Specialty: Family Medicine Why: Let Dr. Artis Flock know you had surgery and follow up for medical issues. Contact information: [address] [place] [address] [phone]               Signed: Sherrie George 12/25/2020, 1:33 PM

## 2021-07-27 ENCOUNTER — Ambulatory Visit: Payer: BC Managed Care – PPO | Admitting: Physician Assistant

## 2021-07-27 ENCOUNTER — Encounter: Payer: BC Managed Care – PPO | Admitting: Family Medicine

## 2021-09-07 IMAGING — US US ABDOMEN LIMITED RUQ/ASCITES
1 series · 14 of 25 positions shown · non-contrast
Comparison: Remote study from 0663

CLINICAL DATA: Abdominal pain, no prior surgeries reported in this
39-year-old female.

EXAM:
ULTRASOUND ABDOMEN LIMITED RIGHT UPPER QUADRANT

[Series 1: us abdomen limited ruq/ascites · 14 of 44 slices shown]
[im 1/44]
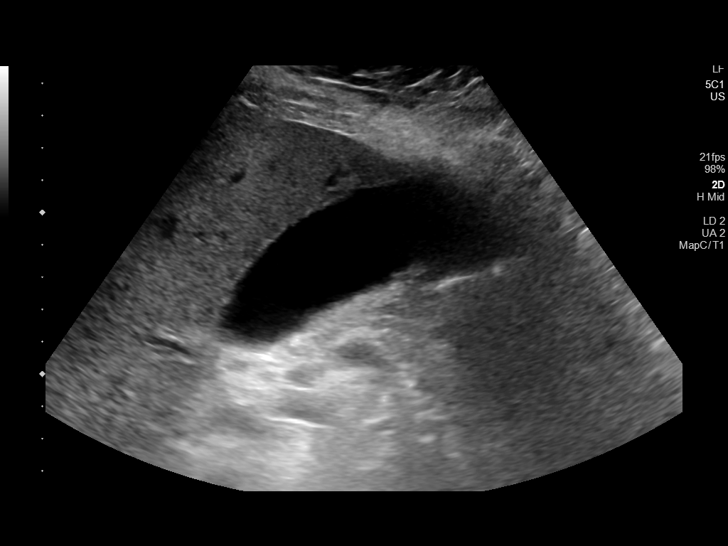
[im 4/44]
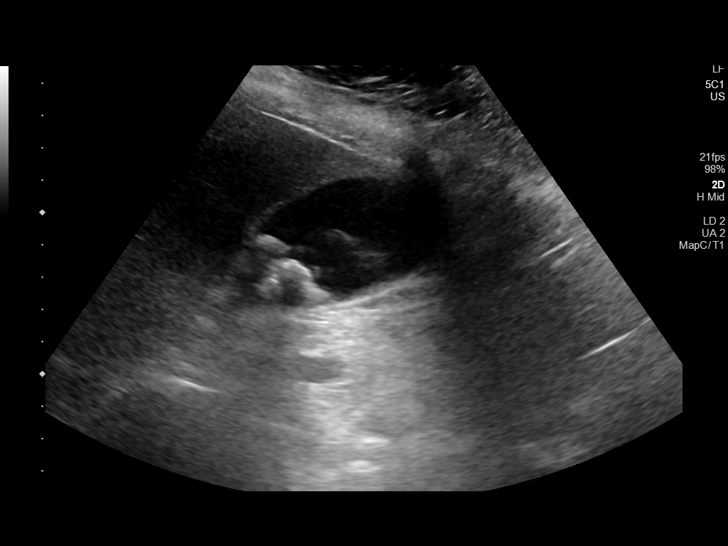
[im 8/44]
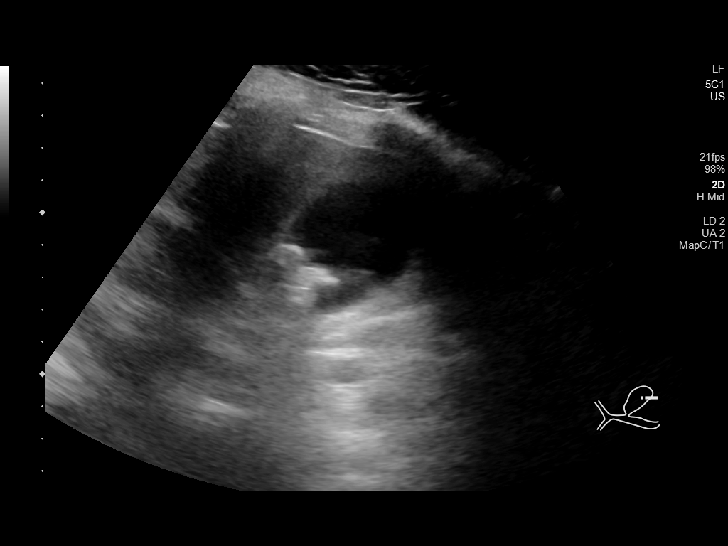
[im 11/44]
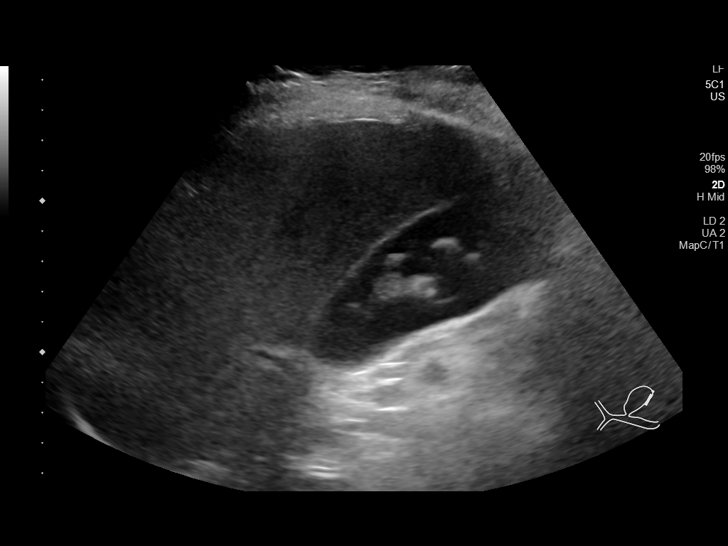
[im 15/44]
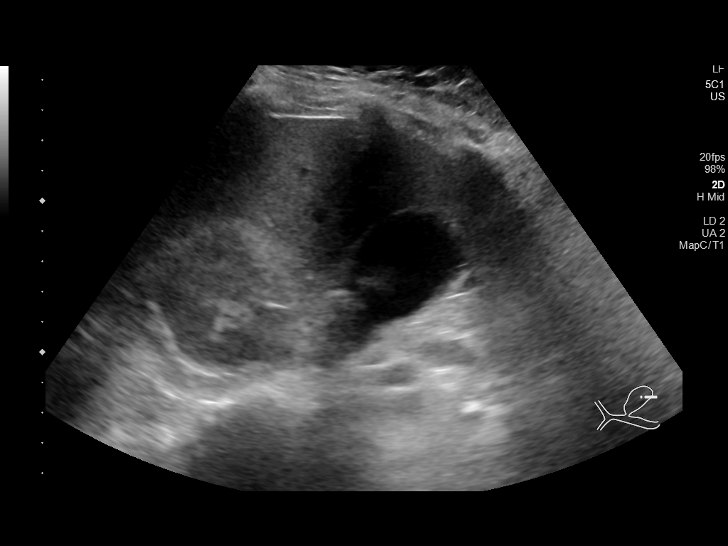
[im 17/44]
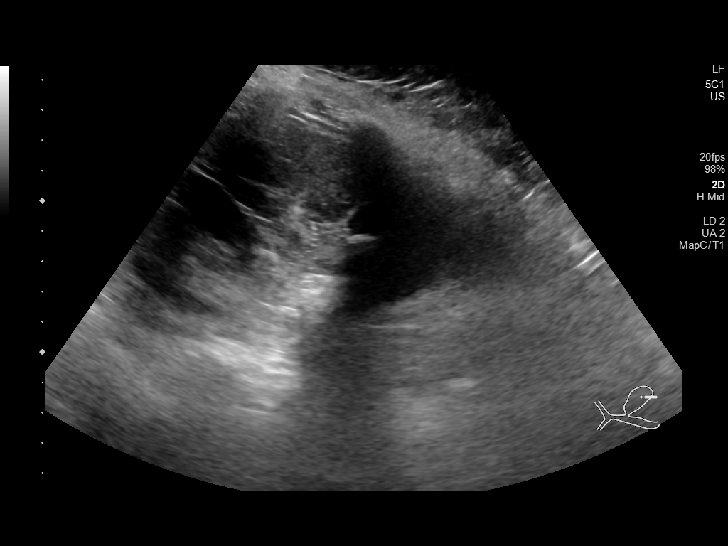
[im 20/44]
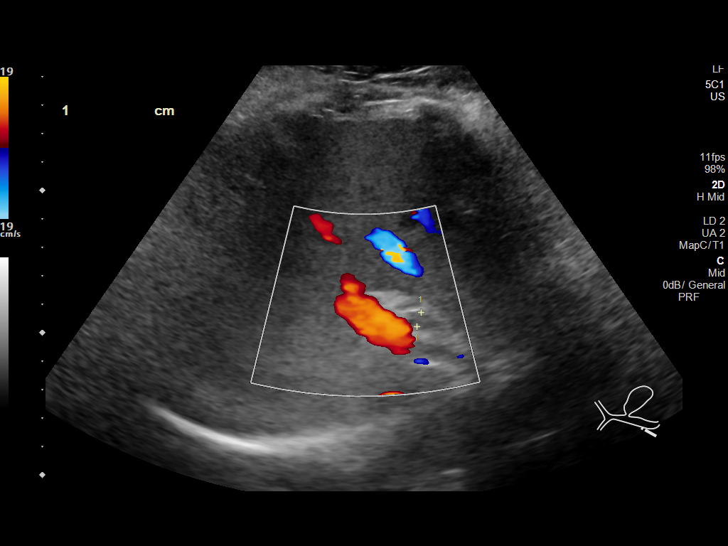
[im 24/44]
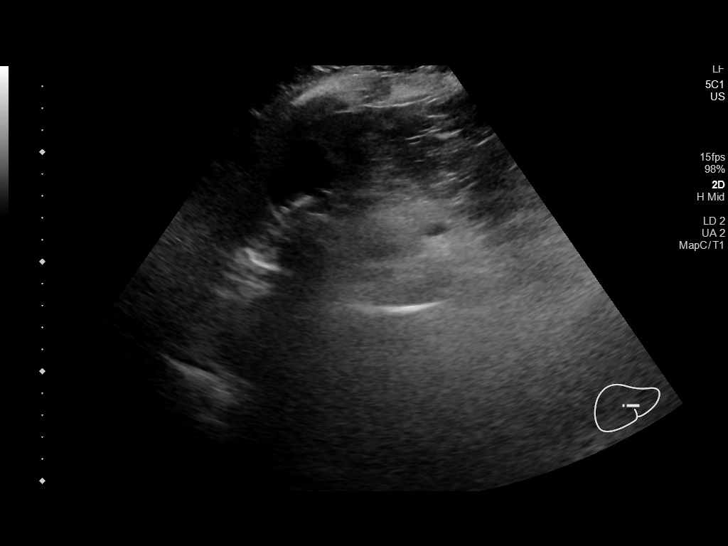
[im 27/44]
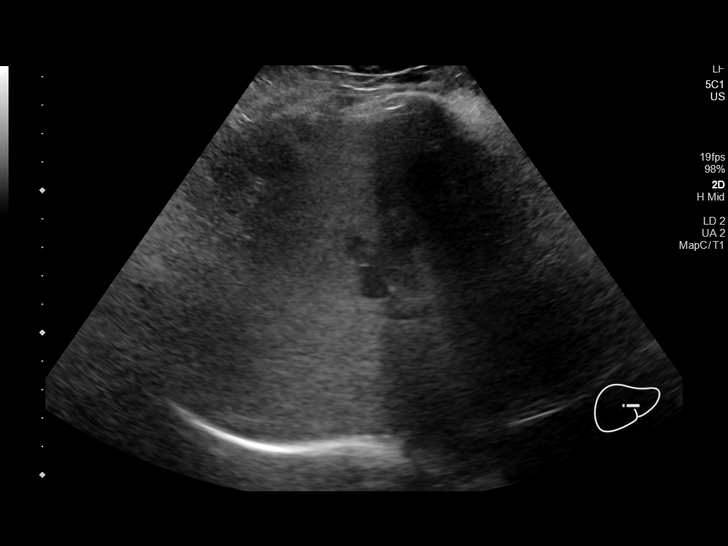
[im 29/44]
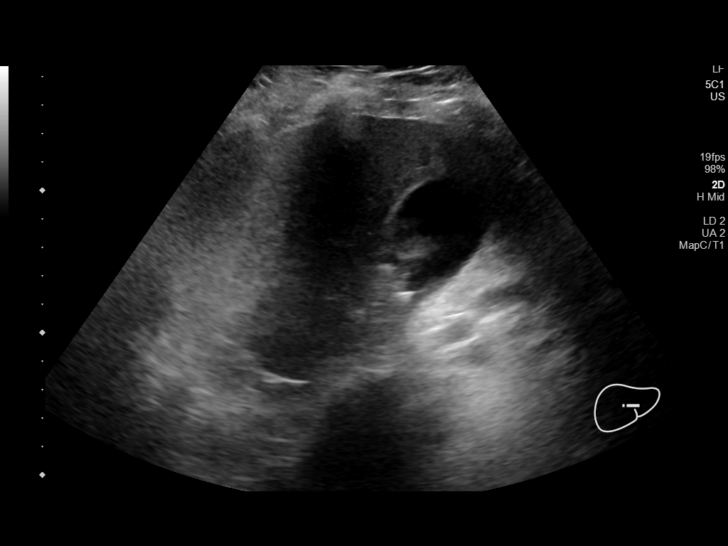
[im 33/44]
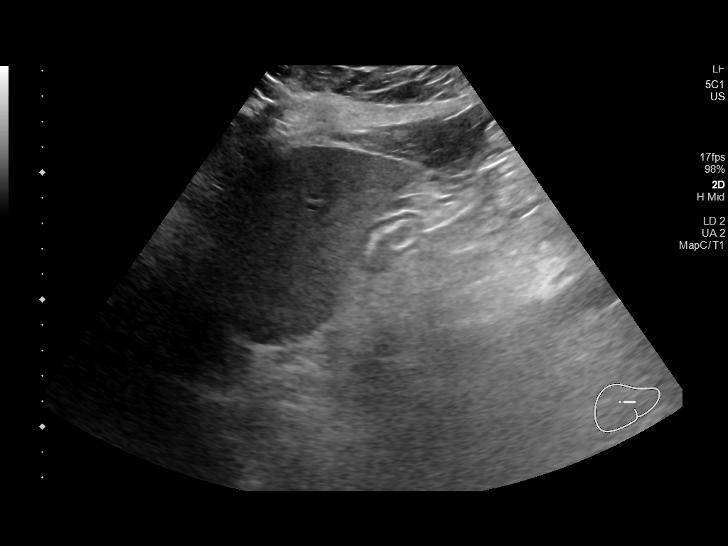
[im 36/44]
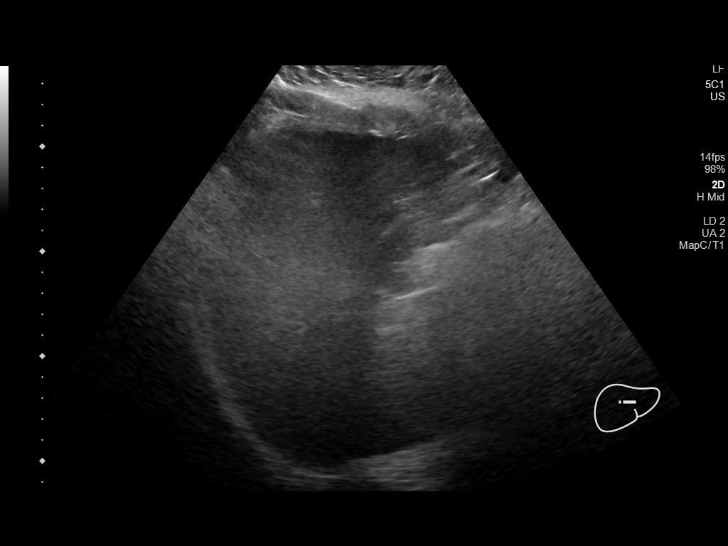
[im 40/44]
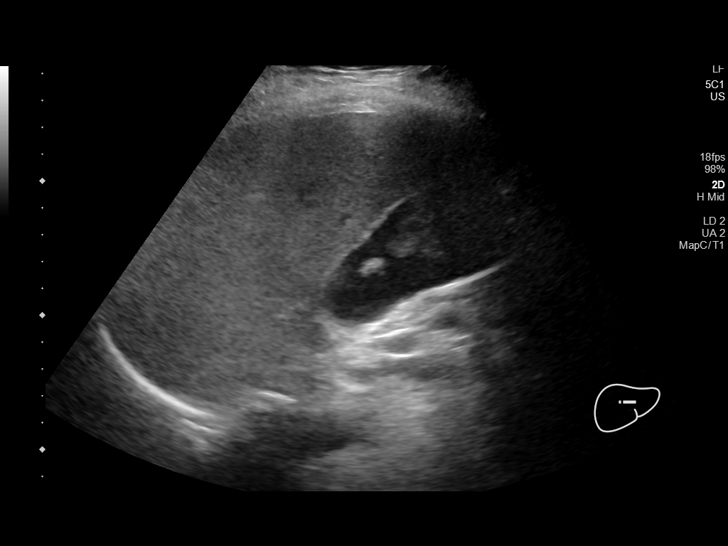
[im 44/44]
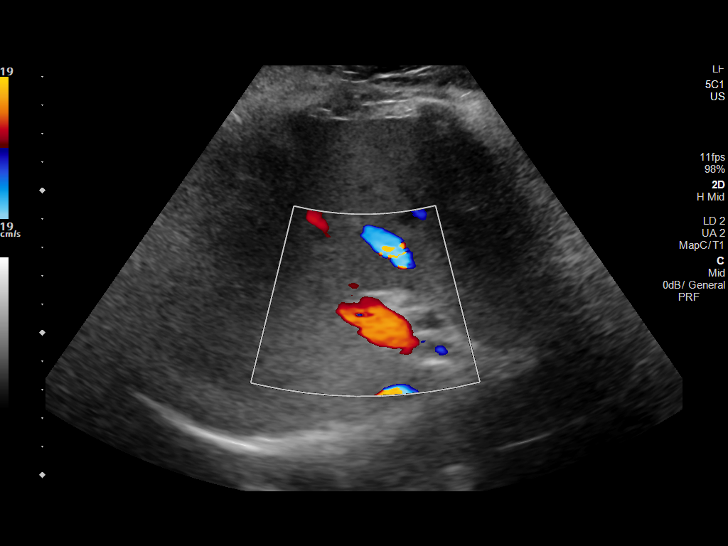

[14 of 25 positions shown; findings below may reference images not displayed]

FINDINGS: Gallbladder:

Numerous gallstones in the gallbladder lumen, mobile the largest
approximately 1.5 cm. No gallbladder wall thickening or
pericholecystic fluid. No reported tenderness over the gallbladder.

Common bile duct:

Diameter: 5.2 mm

Liver:

Echogenic liver, limited assessment overall due to patient body
habitus and hepatic echogenicity. Portal vein is patent on color
Doppler imaging with normal direction of blood flow towards the
liver.

Other: None.
IMPRESSION: Cholelithiasis without associated sonographic signs to suggest acute
cholecystitis at this time.

Top normal size of the common bile duct. Correlate with hepatic
enzymes.

## 2021-09-08 IMAGING — RF DG CHOLANGIOGRAM OPERATIVE
1 series · 9 of 9 positions shown · non-contrast
Comparison: None.

CLINICAL DATA: 39-year-old female with a history of cholelithiasis

EXAM:
INTRAOPERATIVE CHOLANGIOGRAM
TECHNIQUE: Cholangiographic images from the C-arm fluoroscopic device were
submitted for interpretation post-operatively. Please see the
procedural report for the amount of contrast and the fluoroscopy
time utilized.

[Series 1: run · 3 acquisitions, 9 frames shown]
[im 1/3]
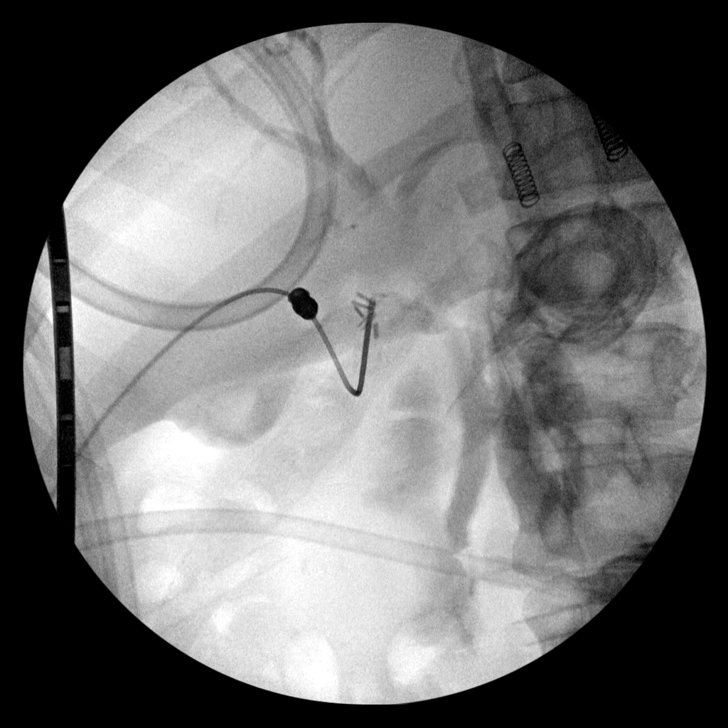
[im 2/3]
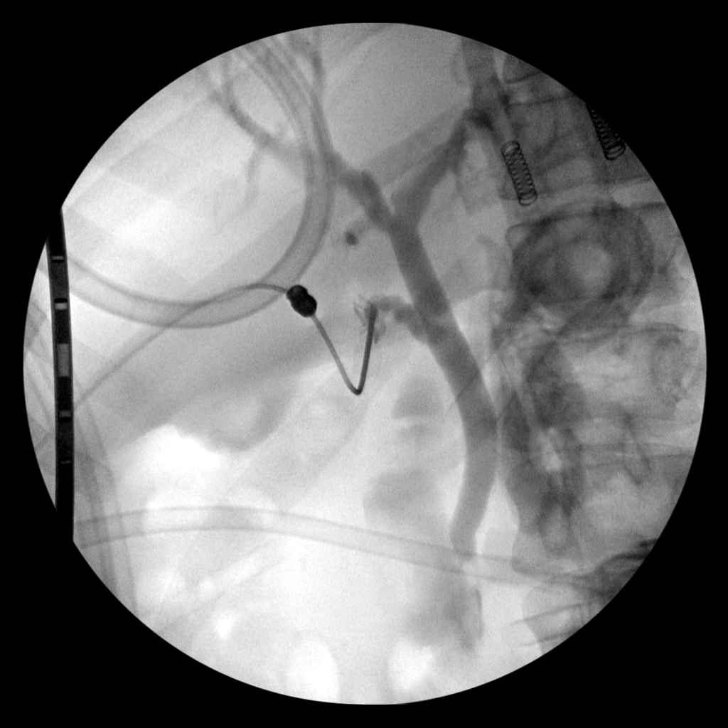
[im 2/3]
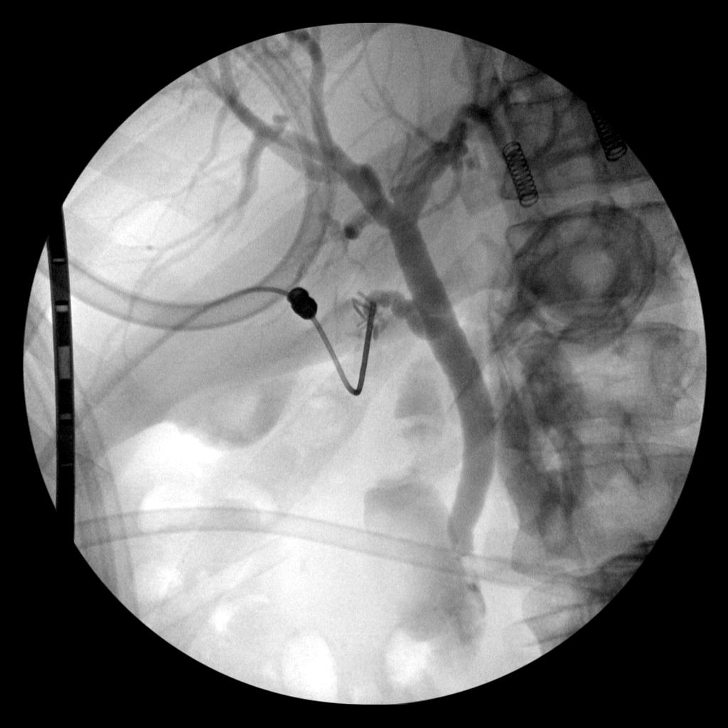
[im 2/3]
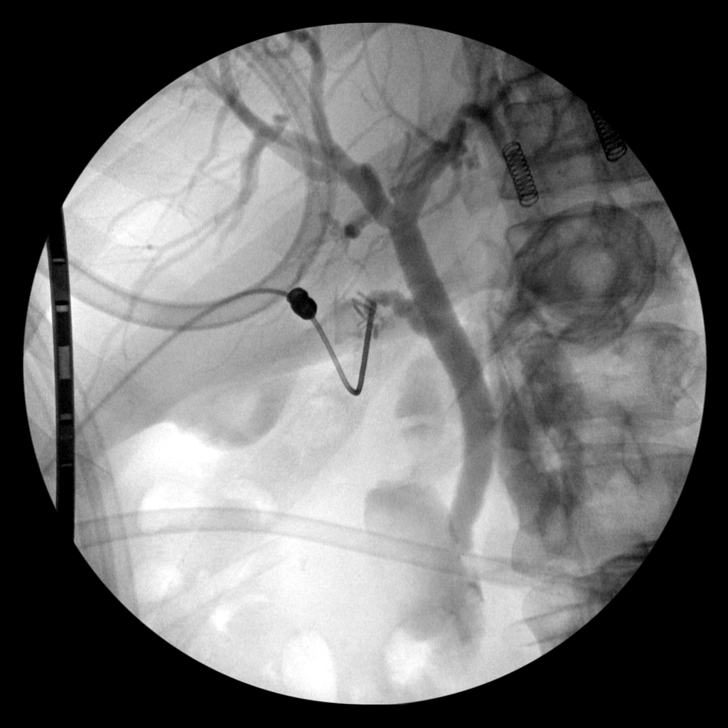
[im 2/3]
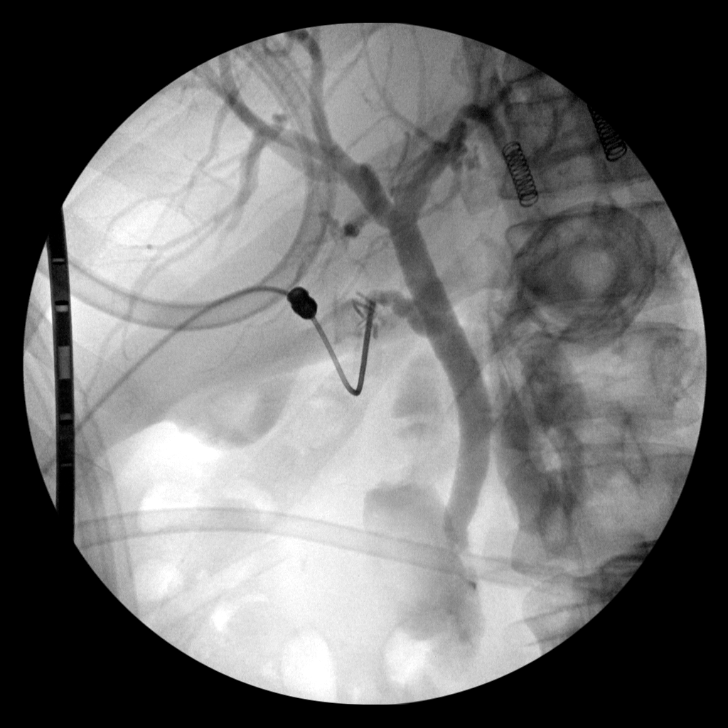
[im 3/3]
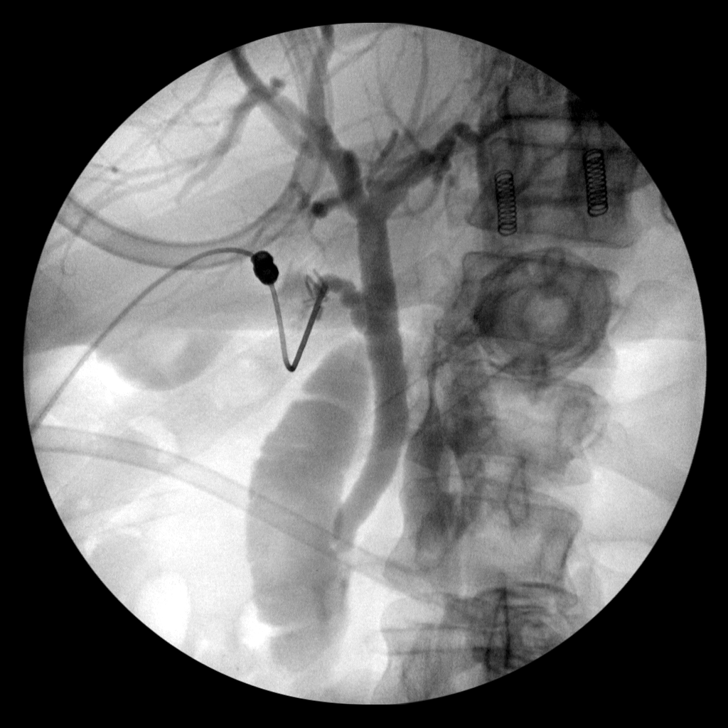
[im 3/3]
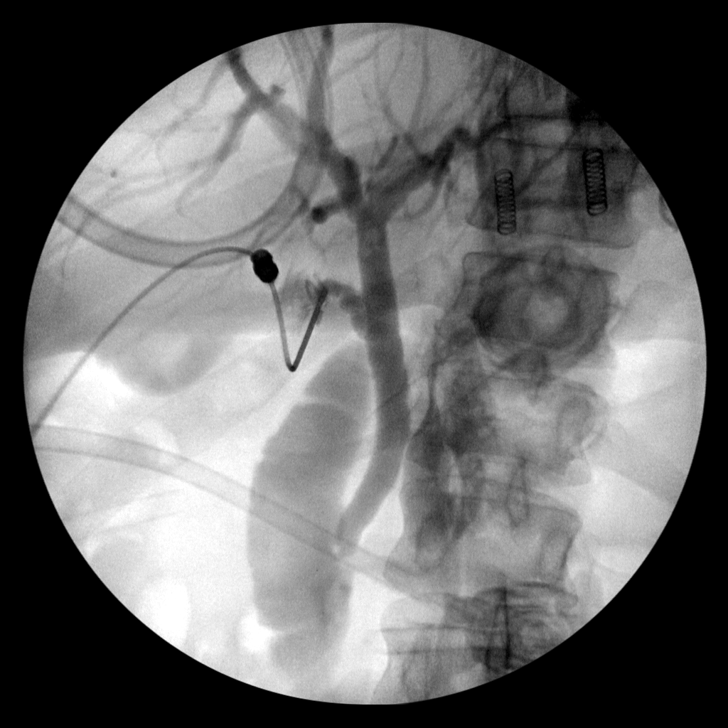
[im 3/3]
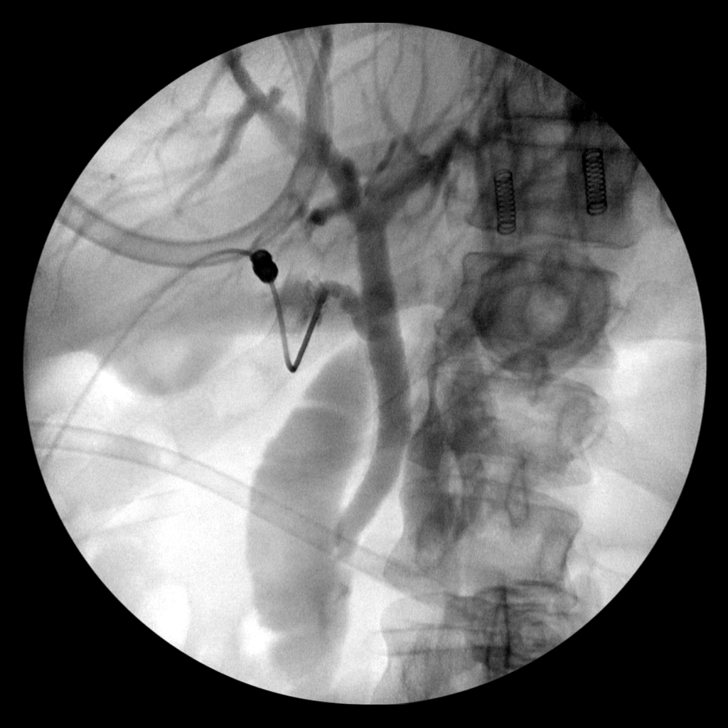
[im 3/3]
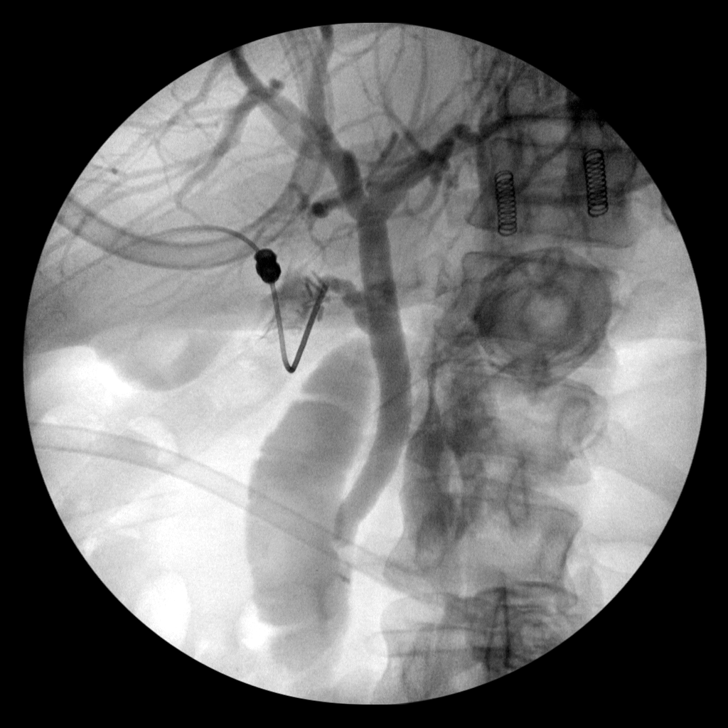

[9 of 9 positions shown; findings below may reference images not displayed]

FINDINGS: Surgical instruments project over the upper abdomen.

There is cannulation of the cystic duct/gallbladder neck, with
antegrade infusion of contrast. Caliber of the extrahepatic ductal
system dilated

There is incomplete contrast column in the distal common bile duct
at the ampulla, with vague filling defect. Contrast traverses this
site and enters the duodenum.

Free flow of contrast across the ampulla.
IMPRESSION: Intraoperative cholangiogram demonstrates extrahepatic biliary ducts
dilated for a patient of this age, with a filling defects just above
the ampulla, concerning for incompletely obstructing retained
debris/choledocholithiasis. Correlation with either ERCP or MRCP
recommended

Please refer to the dictated operative report for full details of
intraoperative findings and procedure

## 2021-10-28 NOTE — L&D Delivery Note (Signed)
Delivery Note At 10:19 AM a viable female was delivered via VBAC, Spontaneous (Presentation: Left Occiput Anterior).  APGAR: 8, 9; weight pending .   Placenta status: Spontaneous, Intact.  Cord: 3 vessels with the following complications:  .  Cord pH: none  Anesthesia: None Episiotomy: None Lacerations: 1st degree Suture Repair: 3.0 chromic Est. Blood Loss (mL): 233  Mom to postpartum.  Baby to Couplet care / Skin to Skin.  Jeani Hawking 06/12/2022, 10:46 AM

## 2021-11-13 LAB — HEPATITIS C ANTIBODY: HCV Ab: NEGATIVE

## 2021-11-13 LAB — OB RESULTS CONSOLE HEPATITIS B SURFACE ANTIGEN: Hepatitis B Surface Ag: NEGATIVE

## 2021-11-13 LAB — OB RESULTS CONSOLE HIV ANTIBODY (ROUTINE TESTING): HIV: NONREACTIVE

## 2021-11-13 LAB — OB RESULTS CONSOLE RPR: RPR: NONREACTIVE

## 2021-11-13 LAB — OB RESULTS CONSOLE RUBELLA ANTIBODY, IGM: Rubella: IMMUNE

## 2021-12-06 LAB — OB RESULTS CONSOLE GC/CHLAMYDIA
Chlamydia: NEGATIVE
Neisseria Gonorrhea: NEGATIVE

## 2021-12-24 ENCOUNTER — Telehealth: Payer: BC Managed Care – PPO | Admitting: Nurse Practitioner

## 2021-12-24 DIAGNOSIS — J0140 Acute pansinusitis, unspecified: Secondary | ICD-10-CM | POA: Diagnosis not present

## 2021-12-24 DIAGNOSIS — H1033 Unspecified acute conjunctivitis, bilateral: Secondary | ICD-10-CM

## 2021-12-24 MED ORDER — AMOXICILLIN-POT CLAVULANATE 875-125 MG PO TABS: 1.0000 | ORAL_TABLET | Freq: Two times a day (BID) | ORAL | 0 refills | Status: AC

## 2021-12-24 MED ORDER — POLYMYXIN B-TRIMETHOPRIM 10000-0.1 UNIT/ML-% OP SOLN: 1.0000 [drp] | Freq: Four times a day (QID) | OPHTHALMIC | 0 refills | Status: AC

## 2021-12-24 NOTE — Progress Notes (Signed)
E-Visit for Sinus Problems  We are sorry that you are not feeling well.  Here is how we plan to help!  Based on what you have shared with me it looks like you have sinusitis.  Sinusitis is inflammation and infection in the sinus cavities of the head.  Based on your presentation I believe you most likely have Acute Bacterial Sinusitis.  This is an infection caused by bacteria and is treated with antibiotics. I have prescribed Augmentin 875mg /125mg  one tablet twice daily with food, for 7 days. We will also call in some antibiotic eye drop that will help relieve the symptoms you are having in your eyes.    You may use an oral decongestant such as Mucinex D or if you have glaucoma or high blood pressure use plain Mucinex. Saline nasal spray help and can safely be used as often as needed for congestion.  If you develop worsening sinus pain, fever or notice severe headache and vision changes, or if symptoms are not better after completion of antibiotic, please schedule an appointment with a health care provider.    Sinus infections are not as easily transmitted as other respiratory infection, however we still recommend that you avoid close contact with loved ones, especially the very young and elderly.  Remember to wash your hands thoroughly throughout the day as this is the number one way to prevent the spread of infection!  Home Care: Only take medications as instructed by your medical team. Complete the entire course of an antibiotic. Do not take these medications with alcohol. A steam or ultrasonic humidifier can help congestion.  You can place a towel over your head and breathe in the steam from hot water coming from a faucet. Avoid close contacts especially the very young and the elderly. Cover your mouth when you cough or sneeze. Always remember to wash your hands.  Get Help Right Away If: You develop worsening fever or sinus pain. You develop a severe head ache or visual changes. Your  symptoms persist after you have completed your treatment plan.  Make sure you Understand these instructions. Will watch your condition. Will get help right away if you are not doing well or get worse.  Thank you for choosing an e-visit.  Your e-visit answers were reviewed by a board certified advanced clinical practitioner to complete your personal care plan. Depending upon the condition, your plan could have included both over the counter or prescription medications.  Please review your pharmacy choice. Make sure the pharmacy is open so you can pick up prescription now. If there is a problem, you may contact your provider through and have the prescription routed to another pharmacy.  Your safety is important to [employer]. If you have drug allergies check your prescription carefully.   For the next 24 hours you can use MyChart to ask questions about today's visit, request a non-urgent call back, or ask for a work or school excuse. You will get an email in the next two days asking about your experience. I hope that your e-visit has been valuable and will speed your recovery.   I spent approximately 7 minutes reviewing the patient's history, current symptoms and coordinating their plan of care today.    Meds ordered this encounter  Medications   amoxicillin-clavulanate (AUGMENTIN) 875-125 MG tablet    Sig: Take 1 tablet by mouth 2 (two) times daily for 7 days. Take with food    Dispense:  14 tablet    Refill:  0   trimethoprim-polymyxin b (POLYTRIM) ophthalmic solution    Sig: Place 1 drop into both eyes in the morning, at noon, in the evening, and at bedtime for 5 days.    Dispense:  10 mL    Refill:  0

## 2022-05-30 LAB — OB RESULTS CONSOLE GBS: GBS: NEGATIVE

## 2022-06-12 ENCOUNTER — Encounter (HOSPITAL_COMMUNITY): Payer: Self-pay | Admitting: *Deleted

## 2022-06-12 ENCOUNTER — Other Ambulatory Visit: Payer: Self-pay

## 2022-06-12 ENCOUNTER — Inpatient Hospital Stay (HOSPITAL_COMMUNITY)
Admission: AD | Admit: 2022-06-12 | Discharge: 2022-06-13 | DRG: 807 | Disposition: A | Discharge status: Home / Self Care | Payer: BC Managed Care – PPO | Attending: Obstetrics and Gynecology | Admitting: Obstetrics and Gynecology

## 2022-06-12 DIAGNOSIS — O34219 Maternal care for unspecified type scar from previous cesarean delivery: Principal | ICD-10-CM | POA: Diagnosis present

## 2022-06-12 DIAGNOSIS — Z3A38 38 weeks gestation of pregnancy: Secondary | ICD-10-CM | POA: Diagnosis not present

## 2022-06-12 DIAGNOSIS — O26893 Other specified pregnancy related conditions, third trimester: Secondary | ICD-10-CM | POA: Diagnosis present

## 2022-06-12 LAB — POCT FERN TEST: POCT Fern Test: POSITIVE

## 2022-06-12 LAB — CBC
HCT: 34.5 % — ABNORMAL LOW (ref 36.0–46.0)
Hemoglobin: 11.9 g/dL — ABNORMAL LOW (ref 12.0–15.0)
MCH: 29.6 pg (ref 26.0–34.0)
MCHC: 34.5 g/dL (ref 30.0–36.0)
MCV: 85.8 fL (ref 80.0–100.0)
Platelets: 345 10*3/uL (ref 150–400)
RBC: 4.02 MIL/uL (ref 3.87–5.11)
RDW: 14.7 % (ref 11.5–15.5)
WBC: 10.3 10*3/uL (ref 4.0–10.5)
nRBC: 0 % (ref 0.0–0.2)

## 2022-06-12 LAB — TYPE AND SCREEN
ABO/RH(D): O POS
Antibody Screen: NEGATIVE

## 2022-06-12 LAB — RPR: RPR Ser Ql: NONREACTIVE

## 2022-06-12 MED ORDER — ONDANSETRON HCL 4 MG/2ML IJ SOLN: 4.0000 mg | Freq: Four times a day (QID) | INTRAMUSCULAR | Status: DC | PRN

## 2022-06-12 MED ORDER — FENTANYL-BUPIVACAINE-NACL 0.5-0.125-0.9 MG/250ML-% EP SOLN
EPIDURAL | Status: AC
  Filled 2022-06-12: qty 250

## 2022-06-12 MED ORDER — OXYTOCIN-SODIUM CHLORIDE 30-0.9 UT/500ML-% IV SOLN
2.5000 [IU]/h | INTRAVENOUS | Status: DC
  Filled 2022-06-12: qty 500

## 2022-06-12 MED ORDER — WITCH HAZEL-GLYCERIN EX PADS: 1.0000 | MEDICATED_PAD | CUTANEOUS | Status: DC | PRN

## 2022-06-12 MED ORDER — IBUPROFEN 600 MG PO TABS
600.0000 mg | ORAL_TABLET | Freq: Four times a day (QID) | ORAL | Status: DC
  Administered 2022-06-12 – 2022-06-13 (×4): 600 mg via ORAL
  Filled 2022-06-12 (×4): qty 1

## 2022-06-12 MED ORDER — LIDOCAINE HCL (PF) 1 % IJ SOLN
30.0000 mL | INTRAMUSCULAR | Status: DC | PRN
  Administered 2022-06-12: 30 mL via SUBCUTANEOUS
  Filled 2022-06-12: qty 30

## 2022-06-12 MED ORDER — OXYCODONE HCL 5 MG PO TABS: 5.0000 mg | ORAL_TABLET | ORAL | Status: DC | PRN

## 2022-06-12 MED ORDER — MEASLES, MUMPS & RUBELLA VAC IJ SOLR: 0.5000 mL | Freq: Once | INTRAMUSCULAR | Status: DC

## 2022-06-12 MED ORDER — MEDROXYPROGESTERONE ACETATE 150 MG/ML IM SUSP: 150.0000 mg | INTRAMUSCULAR | Status: DC | PRN

## 2022-06-12 MED ORDER — OXYTOCIN BOLUS FROM INFUSION
333.0000 mL | Freq: Once | INTRAVENOUS | Status: AC
  Administered 2022-06-12: 333 mL via INTRAVENOUS

## 2022-06-12 MED ORDER — OXYCODONE-ACETAMINOPHEN 5-325 MG PO TABS: 1.0000 | ORAL_TABLET | ORAL | Status: DC | PRN

## 2022-06-12 MED ORDER — SENNOSIDES-DOCUSATE SODIUM 8.6-50 MG PO TABS
2.0000 | ORAL_TABLET | Freq: Every day | ORAL | Status: DC
  Administered 2022-06-13: 2 via ORAL
  Filled 2022-06-12: qty 2

## 2022-06-12 MED ORDER — BISACODYL 10 MG RE SUPP: 10.0000 mg | Freq: Every day | RECTAL | Status: DC | PRN

## 2022-06-12 MED ORDER — FLEET ENEMA 7-19 GM/118ML RE ENEM: 1.0000 | ENEMA | Freq: Every day | RECTAL | Status: DC | PRN

## 2022-06-12 MED ORDER — DIPHENHYDRAMINE HCL 25 MG PO CAPS: 25.0000 mg | ORAL_CAPSULE | Freq: Four times a day (QID) | ORAL | Status: DC | PRN

## 2022-06-12 MED ORDER — LACTATED RINGERS IV SOLN
500.0000 mL | INTRAVENOUS | Status: DC | PRN
Start: 2022-06-12 — End: 2022-06-12

## 2022-06-12 MED ORDER — OXYCODONE-ACETAMINOPHEN 5-325 MG PO TABS: 2.0000 | ORAL_TABLET | ORAL | Status: DC | PRN

## 2022-06-12 MED ORDER — DIBUCAINE (PERIANAL) 1 % EX OINT: 1.0000 | TOPICAL_OINTMENT | CUTANEOUS | Status: DC | PRN

## 2022-06-12 MED ORDER — ACETAMINOPHEN 325 MG PO TABS: 650.0000 mg | ORAL_TABLET | ORAL | Status: DC | PRN

## 2022-06-12 MED ORDER — ONDANSETRON HCL 4 MG/2ML IJ SOLN: 4.0000 mg | INTRAMUSCULAR | Status: DC | PRN

## 2022-06-12 MED ORDER — ONDANSETRON HCL 4 MG PO TABS: 4.0000 mg | ORAL_TABLET | ORAL | Status: DC | PRN

## 2022-06-12 MED ORDER — BENZOCAINE-MENTHOL 20-0.5 % EX AERO
1.0000 | INHALATION_SPRAY | CUTANEOUS | Status: DC | PRN
  Administered 2022-06-12: 1 via TOPICAL
  Filled 2022-06-12: qty 56

## 2022-06-12 MED ORDER — OXYCODONE HCL 5 MG PO TABS: 10.0000 mg | ORAL_TABLET | ORAL | Status: DC | PRN

## 2022-06-12 MED ORDER — SOD CITRATE-CITRIC ACID 500-334 MG/5ML PO SOLN: 30.0000 mL | ORAL | Status: DC | PRN

## 2022-06-12 MED ORDER — LACTATED RINGERS IV SOLN: INTRAVENOUS | Status: DC

## 2022-06-12 MED ORDER — COCONUT OIL OIL: 1.0000 | TOPICAL_OIL | Status: DC | PRN

## 2022-06-12 MED ORDER — PRENATAL MULTIVITAMIN CH
1.0000 | ORAL_TABLET | Freq: Every day | ORAL | Status: DC
  Administered 2022-06-13: 1 via ORAL
  Filled 2022-06-12: qty 1

## 2022-06-12 MED ORDER — TETANUS-DIPHTH-ACELL PERTUSSIS 5-2.5-18.5 LF-MCG/0.5 IM SUSY: 0.5000 mL | PREFILLED_SYRINGE | Freq: Once | INTRAMUSCULAR | Status: DC

## 2022-06-12 MED ORDER — SIMETHICONE 80 MG PO CHEW: 80.0000 mg | CHEWABLE_TABLET | ORAL | Status: DC | PRN

## 2022-06-12 MED ORDER — FLEET ENEMA 7-19 GM/118ML RE ENEM: 1.0000 | ENEMA | RECTAL | Status: DC | PRN

## 2022-06-12 MED ORDER — ZOLPIDEM TARTRATE 5 MG PO TABS: 5.0000 mg | ORAL_TABLET | Freq: Every evening | ORAL | Status: DC | PRN

## 2022-06-12 NOTE — Lactation Note (Signed)
This note was copied from a baby's chart. Lactation Consultation Note  Patient Name: Amber Downs GLOVF'I Date: 06/12/2022 Reason for consult: L&D Initial assessment Age:39 hours  P3, Experienced with breastfeeding. Baby latched with ease.  Lactation to follow up on MBU.  Maternal Data Does the patient have breastfeeding experience prior to this delivery?: Yes  Feeding Mother's Current Feeding Choice: Breast Milk  LATCH Score Latch: Grasps breast easily, tongue down, lips flanged, rhythmical sucking.  Audible Swallowing: A few with stimulation  Type of Nipple: Everted at rest and after stimulation  Comfort (Breast/Nipple): Soft / non-tender  Hold (Positioning): Assistance needed to correctly position infant at breast and maintain latch.  LATCH Score: 8   Interventions Interventions: Assisted with latch;Skin to skin;Education  Consult Status Consult Status: Follow-up from L&D    Dahlia Byes [facility] 06/12/2022, 11:07 AM

## 2022-06-12 NOTE — MAU Note (Signed)
.  Amber Downs is a 41 y.o. at [redacted]w[redacted]d here in MAU reporting leaking fld since 0515. Good FM. Ctxs since 0600. Good FM. 2.5cm TUes in office Onset of complaint: 0505 Pain score: 7 Vitals:   06/12/22 0652 06/12/22 0655  BP:  106/82  Pulse: 91   Resp: 17   Temp: 98.1 F (36.7 C)   SpO2: 99%      FHT:127 Lab orders placed from triage:mau labor eval

## 2022-06-12 NOTE — H&P (Signed)
Laconya Clere is a 41 y.o. G 3 P 2 at 38 weeks presents with SROM clear fluid and active labor. History of C Section and successful VBAC. She is now on L and D and C/C/+2 OB History     Gravida  3   Para  2   Term  2   Preterm      AB      Living  2      SAB      IAB      Ectopic      Multiple  0   Live Births  2          Past Medical History:  Diagnosis Date   Headache    Medical history non-contributory    Past Surgical History:  Procedure Laterality Date   APPENDECTOMY     CESAREAN SECTION N/A 04/06/2013   Procedure: CESAREAN SECTION;  Surgeon: Mitchel Honour, DO;  Location: [facility];  Service: Obstetrics;  Laterality: N/A;  Primary Cesarean Section Delivery Baby Girl @ 1803, Apgars   CHOLECYSTECTOMY N/A 12/20/2020   Procedure: LAPAROSCOPIC CHOLECYSTECTOMY WITH INTRAOPERATIVE CHOLANGIOGRAM;  Surgeon: Quentin Ore, MD;  Location: [facility];  Service: General;  Laterality: N/A;  LDOW   ERCP N/A 12/21/2020   Procedure: ENDOSCOPIC RETROGRADE CHOLANGIOPANCREATOGRAPHY (ERCP);  Surgeon: Kerin Salen, MD;  Location: [facility];  Service: Gastroenterology;  Laterality: N/A;   REMOVAL OF STONES  12/21/2020   Procedure: REMOVAL OF STONES;  Surgeon: Kerin Salen, MD;  Location: [facility];  Service: Gastroenterology;;   Dennison Mascot  12/21/2020   Procedure: Dennison Mascot;  Surgeon: Kerin Salen, MD;  Location: [facility];  Service: Gastroenterology;;   WISDOM TOOTH EXTRACTION     Family History: family history includes Breast cancer in her maternal grandmother and paternal grandmother; Cancer in her paternal grandfather; Diabetes in her paternal grandmother; Heart disease in her father, maternal grandfather, paternal grandfather, and paternal uncle; Stroke in her maternal grandmother, paternal grandfather, and paternal grandmother; Thyroid disease in her paternal grandmother. Social History:  reports that she has never smoked. She has never used smokeless tobacco. She  reports that she does not drink alcohol and does not use drugs.     Maternal Diabetes: No Genetic Screening: Normal Maternal Ultrasounds/Referrals: Normal Fetal Ultrasounds or other Referrals:  None Maternal Substance Abuse:  No Significant Maternal Medications:  None Significant Maternal Lab Results:  Group B Strep negative Number of Prenatal Visits:greater than 3 verified prenatal visits Other Comments:  None  Review of Systems  All other systems reviewed and are negative.  Maternal Medical History:  Reason for admission: Rupture of membranes and contractions.     Dilation: 10 Effacement (%): 100 Station: Plus 2 Exam by:: K.Wilson,RN Blood pressure 106/82, pulse 91, temperature 98.1 F (36.7 C), resp. rate 17, height 5\' 3"  (1.6 m), weight 95.3 kg, SpO2 99 %, unknown if currently breastfeeding. Maternal Exam:  Abdomen: Surgical scars: low transverse.   Fetal presentation: vertex   Fetal Exam Fetal State Assessment: Category I - tracings are normal.   Physical Exam Vitals and nursing note reviewed. Exam conducted with a chaperone present.  Constitutional:      Appearance: Normal appearance.  Cardiovascular:     Pulses: Normal pulses.  Pulmonary:     Effort: Pulmonary effort is normal.  Neurological:     Mental Status: She is alert.     Prenatal labs: ABO, Rh: --/--/O POS (08/16 12-15-1994) Antibody: NEG (08/16 12-15-1994) Rubella:   RPR:  HBsAg:    HIV:    GBS: Negative/-- (08/03 0000)   Assessment/Plan: IUP at term Labor/SROM  Previous C Section - for VBAC - appropriately counseled in the office about the risks of the procedure   Jeani Hawking 06/12/2022, 9:58 AM

## 2022-06-13 LAB — CBC
HCT: 29.7 % — ABNORMAL LOW (ref 36.0–46.0)
Hemoglobin: 10.2 g/dL — ABNORMAL LOW (ref 12.0–15.0)
MCH: 29.3 pg (ref 26.0–34.0)
MCHC: 34.3 g/dL (ref 30.0–36.0)
MCV: 85.3 fL (ref 80.0–100.0)
Platelets: 282 10*3/uL (ref 150–400)
RBC: 3.48 MIL/uL — ABNORMAL LOW (ref 3.87–5.11)
RDW: 14.8 % (ref 11.5–15.5)
WBC: 11.2 10*3/uL — ABNORMAL HIGH (ref 4.0–10.5)
nRBC: 0 % (ref 0.0–0.2)

## 2022-06-13 MED ORDER — IBUPROFEN 600 MG PO TABS: 600.0000 mg | ORAL_TABLET | Freq: Four times a day (QID) | ORAL | 0 refills | Status: AC

## 2022-06-13 MED ORDER — ACETAMINOPHEN 325 MG PO TABS: 650.0000 mg | ORAL_TABLET | ORAL | 0 refills | Status: AC | PRN

## 2022-06-13 NOTE — Lactation Note (Signed)
This note was copied from a baby's chart. Lactation Consultation Note  Patient Name: Amber Downs DVVOH'Y Date: 06/13/2022 Reason for consult: Initial assessment;Early term 37-38.6wks Age:39 hours   P3: Early term infant at 38+0 weeks Feeding preference: Breast  RN requested latch assistance.  NT in room completing hearing screen; "[place]ena [place]" not ready to feed at this time.  Spoke with birth parent regarding her concern with her short shafted nipples.  She informed me that she had to use a nipple shield with her first two children.  With "[place]" she has been able to latch and feed on the right breast but is having difficulty with the left breast.  Observed both nipples to be short shafted, however, breasts are very compressible.  Provided a manual pump with instructions for use and suggested birth parent pre-pump before latching.  Demonstrated manual pump and her nipple everted nicely.  RN had a #20 and a #24 nipple shield at bedside for the next feeding.  Suggested birth parent call for lactation assistance with the nipple shield use.  Birth parent verbalized understanding.  She is hoping to be discharged today.  No support person present at this time.    Maternal Data Has patient been taught Hand Expression?: Yes Does the patient have breastfeeding experience prior to this delivery?: Yes How long did the patient breastfeed?: 6 months with her other two children (26 and 52 years old)  Feeding Mother's Current Feeding Choice: Breast Milk  LATCH Score                    Lactation Tools Discussed/Used    Interventions Interventions: Breast feeding basics reviewed;Education;LC Services brochure  Discharge Pump: Personal  Consult Status Consult Status: Follow-up Date: 06/14/22 Follow-up type: In-patient    Vince Ainsley R Samhita Kretsch 06/13/2022, 5:47 AM

## 2022-06-13 NOTE — Progress Notes (Addendum)
Patient doing well

## 2022-06-13 NOTE — Progress Notes (Signed)
Postpartum Progress Note  Post Partum Day 1 s/p spontaneous vaginal delivery (VBAC).  Patient reports well-controlled pain, ambulating without difficulty, voiding spontaneously, tolerating PO.  Vaginal bleeding is appropriate.   Objective: Blood pressure 103/60, pulse 65, temperature 97.7 F (36.5 C), temperature source Axillary, resp. rate 20, height 5\' 3"  (1.6 m), weight 95.3 kg, SpO2 99 %, unknown if currently breastfeeding.  Physical Exam:  General: alert and no distress Lochia: appropriate Uterine Fundus: firm DVT Evaluation: No evidence of DVT seen on physical exam.  Recent Labs    06/12/22 0812 06/13/22 0335  HGB 11.9* 10.2*  HCT 34.5* 29.7*    Assessment/Plan: Postpartum Day 1, s/p vaginal delivery. Continue routine postpartum care Lactation following Baby girl Anticipate discharge home today   LOS: 1 day   06/15/22 06/13/2022, 7:24 AM

## 2022-06-14 NOTE — Discharge Summary (Signed)
Obstetric Discharge Summary  Amber Downs is a 41 y.o. female that presented on 06/12/2022 for leakage of fluid.  She was admitted to labor and delivery for labor, TOLAC.  Her labor course was uncomplicated and she delivered a viable female infant on 06/12/22.  Her postpartum course was uncomplicated and on PPD#1, she reported well controlled pain, spontaneous voiding, ambulating without difficulty, and tolerating PO.  She was stable for discharge home on 06/13/2022 with plans for in-office follow up.  Hemoglobin  Date Value Ref Range Status  06/13/2022 10.2 (L) 12.0 - 15.0 g/dL Final   HCT  Date Value Ref Range Status  06/13/2022 29.7 (L) 36.0 - 46.0 % Final    Physical Exam:  General: alert and no distress Lochia: appropriate Uterine Fundus: firm DVT Evaluation: No evidence of DVT seen on physical exam.  Discharge Diagnoses: Term Pregnancy-delivered  Discharge Information: Date: 06/14/2022 Activity: Pelvic rest, as tolerated Diet: routine Medications: Tylenol, motrin Condition: stable Instructions: Refer to practice specific booklet.  Discussed prior to discharge.  Discharge to: Home  Follow-up Information     [facility] Follow up.   Why: Please follow up for a 6 week postpartum visit. Contact information: [address] [place] [address] [phone]                 Newborn Data: Live born female  Birth Weight: 6 lb 2.8 oz (2800 g) APGAR: 8, 9  Newborn Delivery   Birth date/time: 06/12/2022 10:19:00 Delivery type: VBAC, Spontaneous      Home with mother.  Amber Downs 06/14/2022, 6:17 AM

## 2022-06-22 ENCOUNTER — Telehealth (HOSPITAL_COMMUNITY): Payer: Self-pay | Admitting: *Deleted

## 2022-06-22 NOTE — Telephone Encounter (Signed)
Mom reports feeling good. No concerns about herself at this time. EPDS=1 [facility] score=1) Mom reports baby is doing well. Feeding, peeing, and pooping without difficulty. Safe sleep reviewed. Mom reports no concerns about baby at present.  Duffy Rhody, RN 06-22-2022 at 2:57pm

## 2022-06-26 ENCOUNTER — Inpatient Hospital Stay (HOSPITAL_COMMUNITY): Admit: 2022-06-26 | Payer: Self-pay
